# Patient Record
Sex: Male | Born: 1985 | Race: White | Hispanic: No | Marital: Single | Smoking: Former smoker
Health system: Southern US, Community
[De-identification: ages and names within clinical notes are randomized; demographics above are authoritative.]

---

## 2007-12-30 ENCOUNTER — Emergency Department: Payer: Self-pay | Admitting: Emergency Medicine

## 2008-01-01 ENCOUNTER — Emergency Department: Payer: Self-pay | Admitting: Internal Medicine

## 2008-01-28 ENCOUNTER — Emergency Department: Payer: Self-pay | Admitting: Emergency Medicine

## 2008-02-06 ENCOUNTER — Emergency Department: Payer: Self-pay | Admitting: Unknown Physician Specialty

## 2008-03-07 ENCOUNTER — Emergency Department: Payer: Self-pay | Admitting: Emergency Medicine

## 2008-09-29 ENCOUNTER — Emergency Department: Payer: Self-pay | Admitting: Emergency Medicine

## 2008-10-27 ENCOUNTER — Emergency Department: Payer: Self-pay | Admitting: Emergency Medicine

## 2009-01-12 ENCOUNTER — Emergency Department (HOSPITAL_COMMUNITY): Admission: EM | Admit: 2009-01-12 | Discharge: 2009-01-12 | Payer: Self-pay | Admitting: Emergency Medicine

## 2009-02-14 ENCOUNTER — Emergency Department (HOSPITAL_COMMUNITY): Admission: EM | Admit: 2009-02-14 | Discharge: 2009-02-14 | Payer: Self-pay | Admitting: Emergency Medicine

## 2009-02-15 ENCOUNTER — Emergency Department (HOSPITAL_COMMUNITY): Admission: EM | Admit: 2009-02-15 | Discharge: 2009-02-16 | Payer: Self-pay | Admitting: Emergency Medicine

## 2009-02-20 ENCOUNTER — Emergency Department: Payer: Self-pay | Admitting: Unknown Physician Specialty

## 2009-05-02 ENCOUNTER — Inpatient Hospital Stay: Payer: Self-pay | Admitting: Psychiatry

## 2009-05-02 ENCOUNTER — Inpatient Hospital Stay: Payer: Self-pay | Admitting: Internal Medicine

## 2011-08-31 ENCOUNTER — Telehealth: Payer: Self-pay

## 2011-08-31 NOTE — Telephone Encounter (Signed)
Pt states he needs to talk to dr Merla Riches re: new rx and a refil

## 2011-09-01 ENCOUNTER — Ambulatory Visit (INDEPENDENT_AMBULATORY_CARE_PROVIDER_SITE_OTHER): Payer: Self-pay | Admitting: Family Medicine

## 2011-09-01 DIAGNOSIS — G8929 Other chronic pain: Secondary | ICD-10-CM

## 2011-09-01 DIAGNOSIS — F909 Attention-deficit hyperactivity disorder, unspecified type: Secondary | ICD-10-CM

## 2011-09-01 DIAGNOSIS — F410 Panic disorder [episodic paroxysmal anxiety] without agoraphobia: Secondary | ICD-10-CM

## 2011-09-01 MED ORDER — TRAMADOL HCL 50 MG PO TABS: 50.0000 mg | ORAL_TABLET | Freq: Three times a day (TID) | ORAL | Status: AC | PRN

## 2011-09-01 MED ORDER — AMPHETAMINE-DEXTROAMPHETAMINE 30 MG PO TABS: 30.0000 mg | ORAL_TABLET | Freq: Two times a day (BID) | ORAL | Status: DC

## 2011-09-01 MED ORDER — CLONAZEPAM 2 MG PO TABS: 2.0000 mg | ORAL_TABLET | Freq: Two times a day (BID) | ORAL | Status: DC | PRN

## 2011-09-01 NOTE — Telephone Encounter (Signed)
Pt called a second time requesting rx refills. Please call pt at [phone] or [phone]

## 2011-09-01 NOTE — Telephone Encounter (Signed)
Patient calling would like for someone to contact he has some questions that need to be answered.

## 2011-09-01 NOTE — Progress Notes (Signed)
  Subjective:    Patient ID: Salmaan Patchin, male    DOB: January 19, 1986, 26 y.o.   MRN: [id number]  HPI 26 yo male with ADD and panic disorder here for medication refills.  Add - on adderall 30 BID Panic - klonopin 2 Tid Doing well.  Trying to establish new PCP in [place] (moved there)  Also, aggravation of chronic pain from moving.  Past reaction to mobic Ibuprofen 800s not working.  Back, neck.  Makes it difficult to sleep   Review of Systems Negative except as per HPI     Objective:   Physical Exam  Constitutional: He appears well-developed and well-nourished.  Cardiovascular: Normal rate, regular rhythm, normal heart sounds and intact distal pulses.   Pulmonary/Chest: Effort normal and breath sounds normal.  Musculoskeletal: Normal range of motion.          Assessment & Plan:  ADD Panic Disorder Pain  See rx's When establishes pcp, can have records sent to support med usage.

## 2011-09-03 ENCOUNTER — Telehealth: Payer: Self-pay

## 2011-09-03 NOTE — Telephone Encounter (Signed)
Pt usually sees Dr. Merla Riches, he wasn't here so he recently saw Dr. Georgiana Shore. PT concerned as was not given any refills on his adderol. [phone]

## 2011-09-04 NOTE — Telephone Encounter (Signed)
Was given rx for adderall. It cannot have refills on it per the law. He can call for refills when needed. Justin Kelley

## 2011-09-05 NOTE — Telephone Encounter (Signed)
COULD NOT LEAVE MESSAGE VM HAD NOT BEEN SET UP YET

## 2011-09-05 NOTE — Telephone Encounter (Signed)
PT WAS SEEN BY DR Georgiana Shore AND WAS GIVEN RX FOR ADDERALL.  HE GOT HIS MED FILL AT [facility] AND THE PHARMACIST MESSED UP HIS ORDER AND ONLY GAVE HIM 36 OF HIS ADDERALL BECAUSE HE WAS GETTING TRAMADOL AS WELL AND ONLY WANTED A FEW OF THOSE.  PT IS VERY UPSET WITH PHARMACY.  HE WOULD LIKE TO KNOW IF CAN GET AN RX FOR WHAT PILLS HE DID NOT GET AND ALSO RX'S WRITTEN FOR THE NEXT TWO MONTHS FOR ADDERALL.

## 2011-09-05 NOTE — Telephone Encounter (Signed)
He should have arranged to see me if he wanted me to be involved in this. He'll havr to square away any med shortage with the pharmacist with Dr Georgiana Shore. I can't give him another 2 months without seeing him. He should have planned ahead and made an appt with me.

## 2011-09-06 MED ORDER — AMPHETAMINE-DEXTROAMPHETAMINE 30 MG PO TABS: 30.0000 mg | ORAL_TABLET | Freq: Two times a day (BID) | ORAL | Status: DC

## 2011-09-06 NOTE — Telephone Encounter (Signed)
We must verify this with the pharmacy.  They should have record of what was filled and how much.  Obtain the pharmacy he filled it at and call them and verify what happened.  If he is unable to give this information, I will not give any more adderall.  If he can give the info and the pharmacy verifies full rX was not given, please ask if we must write a new Rx or if the remainder of the pills can be picked up.  I feel the remainder should be able to be picked up but I may be wrong.  I will not give any more adderall until this has been verified.

## 2011-09-06 NOTE — Telephone Encounter (Signed)
Pt reported he got Rxs filled at [facility]. Called pharmacy and was told pt picked up #36 of Adderall 30 mg, and #30 of tramadol. They do need a new Rx written for the add'l #24 Adderall pt needs to finish supply for the month.

## 2011-09-06 NOTE — Telephone Encounter (Signed)
Dr Georgiana Shore, can you please address this since you were the provider that Rxd last? Evidently, pt asked pharmacist to fill complete Rx of adderall but only part of the tramadol Rx. Reports the pharmacist got it backwards and only gave him part of the adderall and all of the tramadol, so he doesn't have enough adderall.

## 2011-09-06 NOTE — Telephone Encounter (Addendum)
Pt thanked [place] for straightening out this month, but asked if MD had written for next two months. Told him I would check with Dr Georgiana Shore, but he needs to establish with MD in [facility]. Checked with Dr Georgiana Shore and she will not RF for any more months. Explained to pt and also that Dr Merla Riches would need to see him again first. Pt requests [place] to mail his current Rx and he may try to drive back to see Dr Merla Riches if he cant get into see a psychiatrist in [facility]. Mailed pt's RX

## 2011-09-06 NOTE — Telephone Encounter (Signed)
THank you for investigating that.  New Rx entered in and printed off.  Please let patient know ready for pick up.  We are not allowed, by law, to put refills on adderall.  Must have new Rx each month.  F/u with Dr. Merla Riches for next Rxs.

## 2011-09-13 ENCOUNTER — Ambulatory Visit: Payer: Self-pay | Admitting: Internal Medicine

## 2011-09-13 DIAGNOSIS — F410 Panic disorder [episodic paroxysmal anxiety] without agoraphobia: Secondary | ICD-10-CM

## 2011-09-13 DIAGNOSIS — F909 Attention-deficit hyperactivity disorder, unspecified type: Secondary | ICD-10-CM

## 2011-09-13 DIAGNOSIS — F4001 Agoraphobia with panic disorder: Secondary | ICD-10-CM

## 2011-09-13 MED ORDER — AMPHETAMINE-DEXTROAMPHETAMINE 30 MG PO TABS: 30.0000 mg | ORAL_TABLET | Freq: Two times a day (BID) | ORAL | Status: DC

## 2011-09-13 MED ORDER — AMPHETAMINE-DEXTROAMPHETAMINE 30 MG PO TABS: 30.0000 mg | ORAL_TABLET | Freq: Every day | ORAL | Status: DC

## 2011-09-13 NOTE — Progress Notes (Signed)
  Subjective:    Patient ID: Justin Kelley, male    DOB: [birth year], 26 y.o.   MRN: [id number]  HPI Returns for add meds ..fired from [employer]-at least his 10th job loss He continues to struggle with no clear means of support and to ailing parents are separated one in [place] and one in [place]. He has had multiple psychiatric evaluations without clear diagnosis or treatment plans that he can explain though he knows the multiple diagnoses he's been given. It's clear that he's been ADD since early childhood and school was always difficult without medication. He went through all the non-stimulant medicines and has no effect. He has no current therapist nor has he been able to find a psychiatrist that will take his case. He is in the the disability approval process for Medicare. Clonopin has stabilized his anxiety. He has no current depression or suicide ideology.   Review of Systems     Objective:   Physical Exam Oriented to time person and place Vital signs normal     Assessment & Plan:  Problem #1  ADHD  Problem #2 generalized anxiety disorder with insomnia Problem #3 depression Problem #4 agoraphobia Problem #5 past history of opiate abuse  Adderall 30 mg twice a day #60x3 May call for Klonopin refill x1 so that length of the prescription will match Adderall Advised to go to the mental health center in [facility] for psychiatric evaluation in order to consider bipolarity  Followup 3 months

## 2011-10-11 ENCOUNTER — Telehealth: Payer: Self-pay

## 2011-10-11 NOTE — Telephone Encounter (Signed)
[facility] [place] [facility] because [facility] states he should be taking 3/day for Clonapam. Pharmacy states rx says 2/day. Please call [phone] to clarify.

## 2011-10-12 ENCOUNTER — Telehealth: Payer: Self-pay

## 2011-10-12 MED ORDER — CLONAZEPAM 2 MG PO TABS: 2.0000 mg | ORAL_TABLET | Freq: Three times a day (TID) | ORAL | Status: DC | PRN

## 2011-10-12 NOTE — Telephone Encounter (Signed)
[facility]ed in [facility] to [facility] [place], pt notified.

## 2011-10-12 NOTE — Telephone Encounter (Signed)
Chart reviewed.  Clonazepam was for TID.

## 2011-10-12 NOTE — Telephone Encounter (Signed)
Please advise 

## 2011-10-12 NOTE — Telephone Encounter (Signed)
Pt calling again for the 2nd time saying that there is issues with the pharmacy and his clonzipam he needs this medication immediately he has been out all day  [facility] [place]

## 2011-10-12 NOTE — Telephone Encounter (Signed)
Pt states his pharmacy contacted [place] with a request for Clonazpam (generic for klonopin  He takes 1 table of 2mg  3 x day [facility] [phone], [place]    [facility] to [facility]  [phone]  He has been out for a full day and is now shaky.

## 2011-10-13 ENCOUNTER — Other Ambulatory Visit: Payer: Self-pay | Admitting: Internal Medicine

## 2011-10-13 NOTE — Telephone Encounter (Signed)
This Rx has been dealt with through [employer] [facility] on 3/19.  I spoke with pharmacist at [facility] that they should not give the RF because we went another RX with correct directions to pharmacy.

## 2011-11-12 ENCOUNTER — Telehealth: Payer: Self-pay

## 2011-11-12 MED ORDER — CLONAZEPAM 2 MG PO TABS: 2.0000 mg | ORAL_TABLET | Freq: Three times a day (TID) | ORAL | Status: DC | PRN

## 2011-11-12 NOTE — Telephone Encounter (Signed)
This was documented in his last office note Klonopin 2 mg 3 times a day #90 Followup for further meds

## 2011-11-12 NOTE — Telephone Encounter (Signed)
Can we prescribe this? 

## 2011-11-12 NOTE — Telephone Encounter (Signed)
PT IN NEED OF CLONAZEPAM. STATES DR DOOLITTLE TOLD HIM TO CALL WHEN HE WANTED [place] TO CALL IT IN FOR HIM PLEASE CALL [phone]    [facility]

## 2011-11-13 NOTE — Telephone Encounter (Signed)
Faxed RX to [facility], [facility] to notify pt

## 2011-12-10 ENCOUNTER — Ambulatory Visit: Payer: Self-pay | Admitting: Internal Medicine

## 2011-12-10 VITALS — BP 130/79 | HR 79 | Temp 98.6°F | Resp 18 | Ht 69.75 in | Wt 199.0 lb

## 2011-12-10 DIAGNOSIS — F411 Generalized anxiety disorder: Secondary | ICD-10-CM

## 2011-12-10 DIAGNOSIS — F988 Other specified behavioral and emotional disorders with onset usually occurring in childhood and adolescence: Secondary | ICD-10-CM

## 2011-12-10 MED ORDER — AMPHETAMINE-DEXTROAMPHETAMINE 30 MG PO TABS: 30.0000 mg | ORAL_TABLET | Freq: Two times a day (BID) | ORAL | Status: DC

## 2011-12-10 MED ORDER — CLONAZEPAM 2 MG PO TABS: 2.0000 mg | ORAL_TABLET | Freq: Three times a day (TID) | ORAL | Status: DC | PRN

## 2011-12-10 MED ORDER — CLONAZEPAM 2 MG PO TABS: 2.0000 mg | ORAL_TABLET | Freq: Two times a day (BID) | ORAL | Status: DC | PRN

## 2011-12-10 MED ORDER — AMPHETAMINE-DEXTROAMPHETAMINE 30 MG PO TABS: 30.0000 mg | ORAL_TABLET | Freq: Every day | ORAL | Status: DC

## 2011-12-10 NOTE — Progress Notes (Signed)
  Subjective:    Patient ID: Justin Kelley, male    DOB: 1986-02-27, 26 y.o.   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/now living with mom who is paranoid schizophrenic/he has had a recent MRSA infection/no longer needs trazodone for sleep/continues with clonazepam 2 mg 3 times a day for anxiety disorder on a when necessary basis/continues to use Adderall for his ADD/has not seen a psychiatrist because he can't afford it    Review of Systems     Objective:   Physical Exam Vital signs stable/ Neuro intact       Assessment & Plan:  Problem #1 ADD Problem #2 generalized anxiety disorder He  has other psychiatric issues that he is not attending to due to his impoverish state/He is advised to go to the mental Health Center in [facility] and see if he can arrange services/he says he now has a close friend who is a CNA from [employer] who is helping him get things done and she is helping to look for a psychiatrist Meds ordered this encounter  Medications  .       .               . amphetamine-dextroamphetamine (ADDERALL) 30 MG tablet    Sig: Take 1 tablet (30 mg total) by mouth 2 (two) times daily.    Dispense:  60 tablet    Refill:  0  . amphetamine-dextroamphetamine (ADDERALL) 30 MG tablet    Sig: Take 1 tablet (30 mg total) by mouth 2 (two) times daily.    Dispense:  60 tablet    Refill:  0  . amphetamine-dextroamphetamine (ADDERALL) 30 MG tablet    Sig: Take 1 tablet (30 mg total) by mouth twice daily    Dispense:  60 tablet    Refill:  0  . clonazePAM (KLONOPIN) 2 MG tablet    Sig: Take 1 tablet (2 mg total) by mouth 3 (three) times daily as needed for anxiety.    Dispense:  90 tablet    Refill:  2  Recheck in 3 months/I'll try to manage his medications until he can get into adequate psychiatric care

## 2012-03-13 ENCOUNTER — Ambulatory Visit (INDEPENDENT_AMBULATORY_CARE_PROVIDER_SITE_OTHER): Payer: Self-pay | Admitting: Internal Medicine

## 2012-03-13 VITALS — BP 128/72 | HR 72 | Temp 98.5°F | Resp 16 | Wt 194.0 lb

## 2012-03-13 DIAGNOSIS — F411 Generalized anxiety disorder: Secondary | ICD-10-CM

## 2012-03-13 DIAGNOSIS — F4001 Agoraphobia with panic disorder: Secondary | ICD-10-CM

## 2012-03-13 DIAGNOSIS — F909 Attention-deficit hyperactivity disorder, unspecified type: Secondary | ICD-10-CM

## 2012-03-13 DIAGNOSIS — G4700 Insomnia, unspecified: Secondary | ICD-10-CM

## 2012-03-13 DIAGNOSIS — Z22322 Carrier or suspected carrier of Methicillin resistant Staphylococcus aureus: Secondary | ICD-10-CM

## 2012-03-13 MED ORDER — AMPHETAMINE-DEXTROAMPHETAMINE 30 MG PO TABS: 30.0000 mg | ORAL_TABLET | Freq: Two times a day (BID) | ORAL | Status: DC

## 2012-03-13 MED ORDER — CITALOPRAM HYDROBROMIDE 20 MG PO TABS: 20.0000 mg | ORAL_TABLET | Freq: Every day | ORAL | Status: DC

## 2012-03-13 MED ORDER — ALPRAZOLAM 1 MG PO TABS: 1.0000 mg | ORAL_TABLET | Freq: Three times a day (TID) | ORAL | Status: AC | PRN

## 2012-03-13 MED ORDER — MUPIROCIN 2 % EX OINT: TOPICAL_OINTMENT | Freq: Every day | CUTANEOUS | Status: AC

## 2012-03-13 NOTE — Progress Notes (Signed)
  Subjective:    Patient ID: Justin Kelley, male    DOB: 12-05-1985, 26 y.o.   MRN: [id number]  HPI 1. Infected Bee stings patient states total of 14 stings on both arms.  Bee stings became infected and were treated elsewhere.  Placed on bactrim. Recurrent skin infections: will try Bactroban  2. Anxiety medications consult  Presently taking Trazodone and clonazepam trazodone: "zonks" patient out.   Clonazepam: not very much stress relief Not able to sleep very well, would like to switch to alprazolam Was given alprazolam at a healthcare facility and felt like this really helped with anxiety.   3. ADD followup Patient presently not working;is going to school/Living with parents/Current dose of Adderall helping the school  Review of Systems     Objective:   Physical Exam No active pustules or areas of cellulitis Psychiatric intact      Assessment & Plan:   1. ADHD (attention deficit hyperactivity disorder)   2. Agoraphobia with panic disorder -Would discontinue trazodone and go back to Celexa which has worked in the past/also changed to alprazolam instead of Klonopin  3. MRSA (methicillin resistant staph aureus) culture positive In the past Recurrent problems with folliculitis We'll try treatment with Bactroban intranasal for one to 2 months   Meds ordered this encounter  Medications  .       . amphetamine-dextroamphetamine (ADDERALL) 30 MG tablet    Sig: Take 1 tablet (30 mg total) by mouth 2 (two) times daily.    Dispense:  60 tablet    Refill:  0  . amphetamine-dextroamphetamine (ADDERALL) 30 MG tablet    Sig: Take 1 tablet (30 mg total) by mouth 2 (two) times daily. For after 04/13/12    Dispense:  60 tablet    Refill:  0  . amphetamine-dextroamphetamine (ADDERALL) 30 MG tablet    Sig: Take 1 tablet (30 mg total) by mouth 2 (two) times daily. For after 05/13/12    Dispense:  60 tablet    Refill:  0  . citalopram (CELEXA) 20 MG tablet    Sig: Take 1 tablet (20  mg total) by mouth daily.    Dispense:  30 tablet    Refill:  3  . ALPRAZolam (XANAX) 1 MG tablet    Sig: Take 1 tablet (1 mg total) by mouth 3 (three) times daily as needed for sleep.    Dispense:  90 tablet    Refill:  2  . mupirocin ointment (BACTROBAN) 2 %    Sig: Apply topically daily. To both nostrils    Dispense:  22 g    Refill:  2

## 2012-03-21 ENCOUNTER — Telehealth: Payer: Self-pay

## 2012-03-21 NOTE — Telephone Encounter (Signed)
Pt was seen by dr Merla Riches and he changed his medication. States symptoms are not better, he is more anxious and meds not helping Please call at [phone]

## 2012-03-22 NOTE — Telephone Encounter (Signed)
3mg  xanax a day is limit for someone on adderall Can increase celexa to 40mg  Can start buspar Could go back to klonopin 2mg  tid(if returns xanax)

## 2012-03-22 NOTE — Telephone Encounter (Signed)
1.  ADHD (attention deficit hyperactivity disorder)   2.  Agoraphobia with panic disorder -Would discontinue trazodone and go back to Celexa which has worked in the past/also changed to alprazolam instead of Klonopin    From 8/19 office visit please advise.

## 2012-03-23 NOTE — Telephone Encounter (Signed)
Pt returned call and ask to try and call him again. Best time to  Reach him is afternoon.  Phone [phone]

## 2012-03-23 NOTE — Telephone Encounter (Signed)
I have called him to advise, left message for him to call me back about his options.

## 2012-03-26 NOTE — Telephone Encounter (Signed)
LMOM to CB. 

## 2012-03-27 MED ORDER — CITALOPRAM HYDROBROMIDE 40 MG PO TABS: 40.0000 mg | ORAL_TABLET | Freq: Every day | ORAL | Status: DC

## 2012-03-27 MED ORDER — CLONAZEPAM 2 MG PO TABS: 2.0000 mg | ORAL_TABLET | Freq: Three times a day (TID) | ORAL | Status: DC

## 2012-03-27 NOTE — Telephone Encounter (Signed)
Increase Celexa to 40 mg Switch back to Klonopin and fax No need to return Xanax at this point

## 2012-03-27 NOTE — Telephone Encounter (Signed)
Pt is willing to try the increased dose of Celexa 40mg , but has taken Buspar in the past and doesn't like the way it makes him feel. Pt states he has about 10 tablets left of the xanax, but he has been having to take twice the dose because it hasn't been effective. Pt would like to go back on Klonopin but asked if he can take the xanax to pharmacy to save a trip to [place]? He stated if he doesn't get the klonopin tonight, he will really not have much xanax to return. Please send Rxs to [facility] in [place] and advise pt as to what to do.

## 2012-03-27 NOTE — Telephone Encounter (Signed)
Notified pt that Rxs were sent to pharmacy.

## 2012-04-10 ENCOUNTER — Emergency Department: Payer: Self-pay | Admitting: Emergency Medicine

## 2012-04-28 ENCOUNTER — Ambulatory Visit: Payer: Self-pay | Admitting: Orthopedic Surgery

## 2012-05-14 ENCOUNTER — Telehealth: Payer: Self-pay
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
## 2012-05-17 ENCOUNTER — Telehealth: Payer: Self-pay | Admitting: Radiology
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
## 2012-05-22 NOTE — Telephone Encounter (Signed)
nccsrs shows him to be in a drug treatment program that I am not aware of//think we should hold further prescribing until we can discuss with them/you can wait for him to call back

## 2012-05-23 NOTE — Telephone Encounter (Signed)
Left message for him to call me back.  

## 2012-05-23 NOTE — Telephone Encounter (Signed)
I have advised patient he needs office visit, he plans to come in this weekend.

## 2012-07-10 ENCOUNTER — Ambulatory Visit: Payer: Self-pay | Admitting: Internal Medicine

## 2012-07-10 VITALS — BP 136/87 | HR 82 | Temp 98.4°F | Resp 18 | Ht 72.0 in | Wt 198.0 lb

## 2012-07-10 DIAGNOSIS — F1120 Opioid dependence, uncomplicated: Secondary | ICD-10-CM

## 2012-07-10 DIAGNOSIS — F411 Generalized anxiety disorder: Secondary | ICD-10-CM

## 2012-07-10 DIAGNOSIS — F988 Other specified behavioral and emotional disorders with onset usually occurring in childhood and adolescence: Secondary | ICD-10-CM

## 2012-07-10 DIAGNOSIS — F410 Panic disorder [episodic paroxysmal anxiety] without agoraphobia: Secondary | ICD-10-CM

## 2012-07-10 DIAGNOSIS — F909 Attention-deficit hyperactivity disorder, unspecified type: Secondary | ICD-10-CM

## 2012-07-10 DIAGNOSIS — F4001 Agoraphobia with panic disorder: Secondary | ICD-10-CM

## 2012-07-10 MED ORDER — AMPHETAMINE-DEXTROAMPHETAMINE 30 MG PO TABS: 30.0000 mg | ORAL_TABLET | Freq: Two times a day (BID) | ORAL | Status: DC

## 2012-07-10 MED ORDER — CLONAZEPAM 2 MG PO TABS: 2.0000 mg | ORAL_TABLET | Freq: Three times a day (TID) | ORAL | Status: DC
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
## 2012-12-12 ENCOUNTER — Ambulatory Visit: Payer: Self-pay | Admitting: Internal Medicine

## 2012-12-12 VITALS — BP 134/78 | HR 91 | Temp 98.7°F | Resp 16 | Ht 70.5 in | Wt 210.4 lb

## 2012-12-12 DIAGNOSIS — F411 Generalized anxiety disorder: Secondary | ICD-10-CM

## 2012-12-12 DIAGNOSIS — F4001 Agoraphobia with panic disorder: Secondary | ICD-10-CM

## 2012-12-12 DIAGNOSIS — F988 Other specified behavioral and emotional disorders with onset usually occurring in childhood and adolescence: Secondary | ICD-10-CM

## 2012-12-12 DIAGNOSIS — F909 Attention-deficit hyperactivity disorder, unspecified type: Secondary | ICD-10-CM

## 2012-12-12 DIAGNOSIS — G4700 Insomnia, unspecified: Secondary | ICD-10-CM

## 2012-12-12 MED ORDER — AMPHETAMINE-DEXTROAMPHETAMINE 30 MG PO TABS: 30.0000 mg | ORAL_TABLET | Freq: Two times a day (BID) | ORAL | Status: AC

## 2012-12-12 MED ORDER — CLONAZEPAM 2 MG PO TABS: 2.0000 mg | ORAL_TABLET | Freq: Three times a day (TID) | ORAL | Status: AC | PRN

## 2012-12-12 MED ORDER — TRAZODONE HCL 150 MG PO TABS: 150.0000 mg | ORAL_TABLET | Freq: Every day | ORAL | Status: AC

## 2012-12-12 NOTE — Progress Notes (Signed)
  Subjective:    Patient ID: Justin Kelley, male    DOB: [birth year], 27 y.o.   MRN: [id number]  HPI Patient Active Problem List   Diagnosis Date Noted  . Opiate addiction--off sebutex and no opiate relapses 07/10/2012  . Agoraphobia with panic disorder--still needs meds KLON Would like to restart trazadone for sleep 09/13/2011  . ADHD (attention deficit hyperactivity disorder)---?back to school in psych?-still living with and caring for mother 09/01/2011  . Panic disorder 09/01/2011   No othe rhealth issues   Review of Systems Noncontributory.    Objective:   Physical Exam BP 134/78  Pulse 91  Temp(Src) 98.7 F (37.1 C) (Oral)  Resp 16  Ht 5' 10.5" (1.791 m)  Wt 210 lb 6.4 oz (95.437 kg)  BMI 29.75 kg/m2  SpO2 98% Mood good affect stable       Assessment & Plan:  Attention deficit disorder Generalized anxiety disorder Insomnia  Meds ordered this encounter  Medications  . amphetamine-dextroamphetamine (ADDERALL) 30 MG tablet    Sig: Take 1 tablet (30 mg total) by mouth 2 (two) times daily. For after 01/12/13    Dispense:  60 tablet    Refill:  0  . amphetamine-dextroamphetamine (ADDERALL) 30 MG tablet    Sig: Take 1 tablet (30 mg total) by mouth 2 (two) times daily.    Dispense:  60 tablet    Refill:  0  . amphetamine-dextroamphetamine (ADDERALL) 30 MG tablet    Sig: Take 1 tablet (30 mg total) by mouth 2 (two) times daily. For after 02/11/13    Dispense:  60 tablet    Refill:  0  . traZODone (DESYREL) 150 MG tablet    Sig: Take 1 tablet (150 mg total) by mouth at bedtime.    Dispense:  30 tablet    Refill:  5  . clonazePAM (KLONOPIN) 2 MG tablet    Sig: Take 1 tablet (2 mg total) by mouth 3 (three) times daily as needed for anxiety.    Dispense:  90 tablet    Refill:  5   Consider 3 more months of medication in 3mos

## 2013-01-12 ENCOUNTER — Emergency Department: Payer: Self-pay | Admitting: Emergency Medicine

## 2013-02-08 ENCOUNTER — Other Ambulatory Visit: Payer: Self-pay

## 2013-02-08 ENCOUNTER — Emergency Department (HOSPITAL_COMMUNITY)
Admission: EM | Admit: 2013-02-08 | Discharge: 2013-02-08 | Disposition: A | Discharge status: Home / Self Care | Payer: Self-pay | Attending: Emergency Medicine | Admitting: Emergency Medicine

## 2013-02-08 ENCOUNTER — Encounter (HOSPITAL_COMMUNITY): Payer: Self-pay | Admitting: Emergency Medicine

## 2013-02-08 DIAGNOSIS — Y9389 Activity, other specified: Secondary | ICD-10-CM | POA: Insufficient documentation

## 2013-02-08 DIAGNOSIS — T401X1A Poisoning by heroin, accidental (unintentional), initial encounter: Secondary | ICD-10-CM | POA: Insufficient documentation

## 2013-02-08 DIAGNOSIS — Y92410 Unspecified street and highway as the place of occurrence of the external cause: Secondary | ICD-10-CM | POA: Insufficient documentation

## 2013-02-08 DIAGNOSIS — T401X4A Poisoning by heroin, undetermined, initial encounter: Secondary | ICD-10-CM | POA: Insufficient documentation

## 2013-02-08 DIAGNOSIS — Z87891 Personal history of nicotine dependence: Secondary | ICD-10-CM | POA: Insufficient documentation

## 2013-02-08 DIAGNOSIS — Z79899 Other long term (current) drug therapy: Secondary | ICD-10-CM | POA: Insufficient documentation
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
## 2013-02-08 NOTE — ED Notes (Signed)
ZOX:WRUE<AV> Expected date:<BR> Expected time:<BR> Means of arrival:<BR> Comments:<BR> EMS, Heroin OD; Narcan given; pt was apneic upon EMS arrival, now alert
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
## 2013-02-13 ENCOUNTER — Telehealth: Payer: Self-pay
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
## 2013-02-14 NOTE — Telephone Encounter (Signed)
Rtn pt call- states he lost his wallet about a week ago. He states his Adderal Rx was in his wallet. He was seen at the ER on July 17th for a Narcotic overdose. He is doing fine now. He needs his Adderal refill. Please advise.

## 2013-02-15 NOTE — Telephone Encounter (Signed)
Spoke with patient and let him know that Dr. Merla Riches could no longer prescribe the controlled substances.  He stated that he understood and he has a psychiatrist that he is trying to get in with.  Will call back if he needs a referral.

## 2013-02-15 NOTE — Telephone Encounter (Signed)
This is trouble for him. His use of opiates now disqualifies him for treatment by me with other controlled substances which would include adderall as well as klonopin. He will have to be treated by a psychiatrist or someone certified in substance abuse treatment. He could start at the [facility]/or with any private psychiatrist. Do he need a referral by [place]? Would he prefer [place] [place] or [place] where I think he lives?

## 2013-02-16 ENCOUNTER — Emergency Department: Payer: Self-pay | Admitting: Emergency Medicine

## 2013-02-16 LAB — URINALYSIS, COMPLETE
Bacteria: NONE SEEN
Bilirubin,UR: NEGATIVE
Glucose,UR: NEGATIVE mg/dL (ref 0–75)
Nitrite: NEGATIVE
Ph: 5 (ref 4.5–8.0)
Protein: 30
Transitional Epi: <1

## 2013-02-16 LAB — DRUG SCREEN, URINE
Benzodiazepine, Ur Scrn: POSITIVE (ref ?–200)
MDMA (Ecstasy)Ur Screen: NEGATIVE (ref ?–500)
Methadone, Ur Screen: NEGATIVE (ref ?–300)
Opiate, Ur Screen: POSITIVE (ref ?–300)

## 2013-02-16 LAB — CBC
MCHC: 33.6 g/dL (ref 32.0–36.0)
Platelet: 282 10*3/uL (ref 150–440)
WBC: 22.2 10*3/uL — ABNORMAL HIGH (ref 3.8–10.6)

## 2013-02-17 LAB — CBC
MCHC: 33.7 g/dL (ref 32.0–36.0)
MCV: 83 fL (ref 80–100)
Platelet: 249 10*3/uL (ref 150–440)
RBC: 4.79 10*6/uL (ref 4.40–5.90)

## 2013-02-17 LAB — COMPREHENSIVE METABOLIC PANEL
Alkaline Phosphatase: 64 U/L (ref 50–136)
Anion Gap: 7 (ref 7–16)
BUN: 17 mg/dL (ref 7–18)
Bilirubin,Total: 0.2 mg/dL (ref 0.2–1.0)
Calcium, Total: 8.9 mg/dL (ref 8.5–10.1)
Chloride: 107 mmol/L (ref 98–107)
EGFR (Non-African Amer.): >60
Glucose: 109 mg/dL — ABNORMAL HIGH (ref 65–99)
Potassium: 3.9 mmol/L (ref 3.5–5.1)
SGOT(AST): 19 U/L (ref 15–37)
Sodium: 139 mmol/L (ref 136–145)
Total Protein: 8.5 g/dL — ABNORMAL HIGH (ref 6.4–8.2)

## 2013-02-17 LAB — ETHANOL: Ethanol: <3 mg/dL

## 2013-02-18 LAB — URINE CULTURE

## 2013-02-22 LAB — CULTURE, BLOOD (SINGLE)

## 2013-02-28 ENCOUNTER — Telehealth: Payer: Self-pay

## 2013-02-28 DIAGNOSIS — F411 Generalized anxiety disorder: Secondary | ICD-10-CM

## 2013-02-28 NOTE — Telephone Encounter (Signed)
Pt talked to Dr Merla Riches while back about getting a referral to psychiatrist. The patient has found one that he likes and he wants to know if he can still get the referral Call back number is [phone]    [facility] [phone] Fax [phone]

## 2013-02-28 NOTE — Telephone Encounter (Signed)
Yes, referral made

## 2013-11-13 ENCOUNTER — Emergency Department: Payer: Self-pay | Admitting: Emergency Medicine

## 2013-11-13 LAB — BASIC METABOLIC PANEL
ANION GAP: 7 (ref 7–16)
BUN: 7 mg/dL (ref 7–18)
CO2: 25 mmol/L (ref 21–32)
Calcium, Total: 8.4 mg/dL — ABNORMAL LOW (ref 8.5–10.1)
Chloride: 104 mmol/L (ref 98–107)
Creatinine: 0.93 mg/dL (ref 0.60–1.30)
EGFR (Non-African Amer.): >60
GLUCOSE: 78 mg/dL (ref 65–99)
OSMOLALITY: 269 (ref 275–301)
POTASSIUM: 3.4 mmol/L — AB (ref 3.5–5.1)
Sodium: 136 mmol/L (ref 136–145)

## 2013-11-13 LAB — CBC
HCT: 43.5 % (ref 40.0–52.0)
HGB: 14.3 g/dL (ref 13.0–18.0)
MCH: 26.5 pg (ref 26.0–34.0)
MCHC: 32.9 g/dL (ref 32.0–36.0)
MCV: 81 fL (ref 80–100)
Platelet: 312 10*3/uL (ref 150–440)
RBC: 5.39 10*6/uL (ref 4.40–5.90)
RDW: 14 % (ref 11.5–14.5)
WBC: 21.3 10*3/uL — AB (ref 3.8–10.6)

## 2013-11-14 ENCOUNTER — Inpatient Hospital Stay: Payer: Self-pay | Admitting: Internal Medicine

## 2013-11-14 LAB — BASIC METABOLIC PANEL
Anion Gap: 5 — ABNORMAL LOW (ref 7–16)
BUN: 7 mg/dL (ref 7–18)
CO2: 29 mmol/L (ref 21–32)
CREATININE: 1.02 mg/dL (ref 0.60–1.30)
Calcium, Total: 8.2 mg/dL — ABNORMAL LOW (ref 8.5–10.1)
Chloride: 100 mmol/L (ref 98–107)
EGFR (African American): >60
EGFR (Non-African Amer.): >60
GLUCOSE: 67 mg/dL (ref 65–99)
OSMOLALITY: 264 (ref 275–301)
POTASSIUM: 3 mmol/L — AB (ref 3.5–5.1)
Sodium: 134 mmol/L — ABNORMAL LOW (ref 136–145)

## 2013-11-14 LAB — CBC WITH DIFFERENTIAL/PLATELET
Basophil #: 0.1 10*3/uL (ref 0.0–0.1)
Basophil %: 0.6 %
EOS ABS: 0.1 10*3/uL (ref 0.0–0.7)
Eosinophil %: 0.5 %
HCT: 40.8 % (ref 40.0–52.0)
HGB: 13.2 g/dL (ref 13.0–18.0)
Lymphocyte #: 2.5 10*3/uL (ref 1.0–3.6)
Lymphocyte %: 12.5 %
MCH: 26.3 pg (ref 26.0–34.0)
MCHC: 32.4 g/dL (ref 32.0–36.0)
MCV: 81 fL (ref 80–100)
Monocyte #: 1.3 x10 3/mm — ABNORMAL HIGH (ref 0.2–1.0)
Monocyte %: 6.6 %
NEUTROS ABS: 16 10*3/uL — AB (ref 1.4–6.5)
NEUTROS PCT: 79.8 %
Platelet: 271 10*3/uL (ref 150–440)
RBC: 5.03 10*6/uL (ref 4.40–5.90)
RDW: 14.1 % (ref 11.5–14.5)
WBC: 20 10*3/uL — ABNORMAL HIGH (ref 3.8–10.6)

## 2013-11-16 LAB — BASIC METABOLIC PANEL
ANION GAP: 8 (ref 7–16)
BUN: 7 mg/dL (ref 7–18)
CHLORIDE: 101 mmol/L (ref 98–107)
Calcium, Total: 8.8 mg/dL (ref 8.5–10.1)
Co2: 27 mmol/L (ref 21–32)
Creatinine: 0.9 mg/dL (ref 0.60–1.30)
EGFR (African American): >60
EGFR (Non-African Amer.): >60
GLUCOSE: 81 mg/dL (ref 65–99)
Osmolality: 269 (ref 275–301)
Potassium: 3.7 mmol/L (ref 3.5–5.1)
Sodium: 136 mmol/L (ref 136–145)

## 2013-11-16 LAB — CBC WITH DIFFERENTIAL/PLATELET
BASOS PCT: 0.7 %
Basophil #: 0.1 10*3/uL (ref 0.0–0.1)
Eosinophil #: 0.2 10*3/uL (ref 0.0–0.7)
Eosinophil %: 1.3 %
HCT: 42.2 % (ref 40.0–52.0)
HGB: 13.8 g/dL (ref 13.0–18.0)
LYMPHS PCT: 13.5 %
Lymphocyte #: 2.2 10*3/uL (ref 1.0–3.6)
MCH: 26.7 pg (ref 26.0–34.0)
MCHC: 32.7 g/dL (ref 32.0–36.0)
MCV: 82 fL (ref 80–100)
MONOS PCT: 6.2 %
Monocyte #: 1 x10 3/mm (ref 0.2–1.0)
NEUTROS PCT: 78.3 %
Neutrophil #: 12.5 10*3/uL — ABNORMAL HIGH (ref 1.4–6.5)
PLATELETS: 307 10*3/uL (ref 150–440)
RBC: 5.18 10*6/uL (ref 4.40–5.90)
RDW: 14 % (ref 11.5–14.5)
WBC: 16 10*3/uL — ABNORMAL HIGH (ref 3.8–10.6)

## 2013-11-16 LAB — VANCOMYCIN, TROUGH: VANCOMYCIN, TROUGH: 12 ug/mL (ref 10–20)

## 2013-11-17 LAB — BASIC METABOLIC PANEL
ANION GAP: 4 — AB (ref 7–16)
BUN: 4 mg/dL — AB (ref 7–18)
CHLORIDE: 103 mmol/L (ref 98–107)
CO2: 28 mmol/L (ref 21–32)
CREATININE: 0.98 mg/dL (ref 0.60–1.30)
Calcium, Total: 8.7 mg/dL (ref 8.5–10.1)
EGFR (African American): >60
Glucose: 87 mg/dL (ref 65–99)
Osmolality: 266 (ref 275–301)
Potassium: 3.8 mmol/L (ref 3.5–5.1)
SODIUM: 135 mmol/L — AB (ref 136–145)

## 2013-11-17 LAB — CBC WITH DIFFERENTIAL/PLATELET
Basophil #: 0.1 10*3/uL (ref 0.0–0.1)
Basophil %: 0.8 %
EOS PCT: 2.4 %
Eosinophil #: 0.3 10*3/uL (ref 0.0–0.7)
HCT: 41.4 % (ref 40.0–52.0)
HGB: 13.4 g/dL (ref 13.0–18.0)
Lymphocyte #: 2 10*3/uL (ref 1.0–3.6)
Lymphocyte %: 18.3 %
MCH: 26.3 pg (ref 26.0–34.0)
MCHC: 32.3 g/dL (ref 32.0–36.0)
MCV: 81 fL (ref 80–100)
Monocyte #: 0.7 x10 3/mm (ref 0.2–1.0)
Monocyte %: 6.6 %
Neutrophil #: 7.8 10*3/uL — ABNORMAL HIGH (ref 1.4–6.5)
Neutrophil %: 71.9 %
Platelet: 317 10*3/uL (ref 150–440)
RBC: 5.08 10*6/uL (ref 4.40–5.90)
RDW: 14.1 % (ref 11.5–14.5)
WBC: 10.8 10*3/uL — ABNORMAL HIGH (ref 3.8–10.6)

## 2013-11-17 LAB — VANCOMYCIN, TROUGH: VANCOMYCIN, TROUGH: 14 ug/mL (ref 10–20)

## 2013-11-18 LAB — BASIC METABOLIC PANEL
ANION GAP: 5 — AB (ref 7–16)
BUN: 5 mg/dL — AB (ref 7–18)
CALCIUM: 8.9 mg/dL (ref 8.5–10.1)
Chloride: 104 mmol/L (ref 98–107)
Co2: 27 mmol/L (ref 21–32)
Creatinine: 0.9 mg/dL (ref 0.60–1.30)
EGFR (African American): >60
EGFR (Non-African Amer.): >60
Glucose: 85 mg/dL (ref 65–99)
Osmolality: 268 (ref 275–301)
Potassium: 4 mmol/L (ref 3.5–5.1)
Sodium: 136 mmol/L (ref 136–145)

## 2013-11-18 LAB — CBC WITH DIFFERENTIAL/PLATELET
BASOS ABS: 2 %
Comment - H1-Com2: NORMAL
EOS PCT: 3 %
HCT: 40.8 % (ref 40.0–52.0)
HGB: 13.4 g/dL (ref 13.0–18.0)
LYMPHS PCT: 36 %
MCH: 26.9 pg (ref 26.0–34.0)
MCHC: 32.8 g/dL (ref 32.0–36.0)
MCV: 82 fL (ref 80–100)
Monocytes: 9 %
Platelet: 307 10*3/uL (ref 150–440)
RBC: 4.98 10*6/uL (ref 4.40–5.90)
RDW: 14.2 % (ref 11.5–14.5)
SEGMENTED NEUTROPHILS: 50 %
WBC: 8.3 10*3/uL (ref 3.8–10.6)

## 2013-11-18 LAB — CULTURE, BLOOD (SINGLE)

## 2013-11-21 LAB — WOUND CULTURE

## 2013-12-14 ENCOUNTER — Inpatient Hospital Stay: Payer: Self-pay | Admitting: Internal Medicine

## 2013-12-14 LAB — COMPREHENSIVE METABOLIC PANEL
ALK PHOS: 82 U/L
AST: 43 U/L — AB (ref 15–37)
Albumin: 3.7 g/dL (ref 3.4–5.0)
Anion Gap: 6 — ABNORMAL LOW (ref 7–16)
BUN: 13 mg/dL (ref 7–18)
Bilirubin,Total: 0.5 mg/dL (ref 0.2–1.0)
CO2: 29 mmol/L (ref 21–32)
Calcium, Total: 8.9 mg/dL (ref 8.5–10.1)
Chloride: 101 mmol/L (ref 98–107)
Creatinine: 1.18 mg/dL (ref 0.60–1.30)
EGFR (African American): >60
EGFR (Non-African Amer.): >60
GLUCOSE: 82 mg/dL (ref 65–99)
Osmolality: 271 (ref 275–301)
Potassium: 4.8 mmol/L (ref 3.5–5.1)
SGPT (ALT): 29 U/L (ref 12–78)
Sodium: 136 mmol/L (ref 136–145)
TOTAL PROTEIN: 8.3 g/dL — AB (ref 6.4–8.2)

## 2013-12-14 LAB — CBC WITH DIFFERENTIAL/PLATELET
Basophil #: 0.1 10*3/uL (ref 0.0–0.1)
Basophil %: 0.7 %
EOS ABS: 0.2 10*3/uL (ref 0.0–0.7)
EOS PCT: 2.5 %
HCT: 42.9 % (ref 40.0–52.0)
HGB: 14.3 g/dL (ref 13.0–18.0)
Lymphocyte #: 2.2 10*3/uL (ref 1.0–3.6)
Lymphocyte %: 21.9 %
MCH: 25.9 pg — ABNORMAL LOW (ref 26.0–34.0)
MCHC: 33.3 g/dL (ref 32.0–36.0)
MCV: 78 fL — ABNORMAL LOW (ref 80–100)
MONO ABS: 0.6 x10 3/mm (ref 0.2–1.0)
Monocyte %: 6 %
NEUTROS PCT: 68.9 %
Neutrophil #: 7 10*3/uL — ABNORMAL HIGH (ref 1.4–6.5)
Platelet: 207 10*3/uL (ref 150–440)
RBC: 5.51 10*6/uL (ref 4.40–5.90)
RDW: 14 % (ref 11.5–14.5)
WBC: 10.1 10*3/uL (ref 3.8–10.6)

## 2013-12-14 LAB — TROPONIN I

## 2013-12-14 LAB — TSH: Thyroid Stimulating Horm: 5.12 u[IU]/mL — ABNORMAL HIGH

## 2013-12-15 LAB — URINALYSIS, COMPLETE
BLOOD: NEGATIVE
Bacteria: NONE SEEN
Bilirubin,UR: NEGATIVE
Glucose,UR: NEGATIVE mg/dL (ref 0–75)
Ketone: NEGATIVE
Leukocyte Esterase: NEGATIVE
NITRITE: NEGATIVE
Ph: 7 (ref 4.5–8.0)
Protein: NEGATIVE
Specific Gravity: 1.012 (ref 1.003–1.030)
Squamous Epithelial: NONE SEEN
WBC UR: 1 /HPF (ref 0–5)

## 2013-12-15 LAB — BASIC METABOLIC PANEL
Anion Gap: 3 — ABNORMAL LOW (ref 7–16)
BUN: 14 mg/dL (ref 7–18)
CALCIUM: 8.9 mg/dL (ref 8.5–10.1)
CHLORIDE: 103 mmol/L (ref 98–107)
Co2: 29 mmol/L (ref 21–32)
Creatinine: 1.45 mg/dL — ABNORMAL HIGH (ref 0.60–1.30)
EGFR (African American): >60
GLUCOSE: 88 mg/dL (ref 65–99)
OSMOLALITY: 270 (ref 275–301)
Potassium: 4.2 mmol/L (ref 3.5–5.1)
Sodium: 135 mmol/L — ABNORMAL LOW (ref 136–145)

## 2013-12-15 LAB — CBC WITH DIFFERENTIAL/PLATELET
Basophil #: 0.1 10*3/uL (ref 0.0–0.1)
Basophil %: 0.9 %
EOS PCT: 3.8 %
Eosinophil #: 0.3 10*3/uL (ref 0.0–0.7)
HCT: 43.5 % (ref 40.0–52.0)
HGB: 14.7 g/dL (ref 13.0–18.0)
LYMPHS ABS: 2.2 10*3/uL (ref 1.0–3.6)
Lymphocyte %: 28.1 %
MCH: 26.3 pg (ref 26.0–34.0)
MCHC: 33.9 g/dL (ref 32.0–36.0)
MCV: 78 fL — AB (ref 80–100)
Monocyte #: 0.6 x10 3/mm (ref 0.2–1.0)
Monocyte %: 8.1 %
Neutrophil #: 4.6 10*3/uL (ref 1.4–6.5)
Neutrophil %: 59.1 %
Platelet: 200 10*3/uL (ref 150–440)
RBC: 5.59 10*6/uL (ref 4.40–5.90)
RDW: 14 % (ref 11.5–14.5)
WBC: 7.8 10*3/uL (ref 3.8–10.6)

## 2013-12-15 LAB — DRUG SCREEN, URINE
AMPHETAMINES, UR SCREEN: POSITIVE (ref ?–1000)
BARBITURATES, UR SCREEN: NEGATIVE (ref ?–200)
BENZODIAZEPINE, UR SCRN: POSITIVE (ref ?–200)
Cannabinoid 50 Ng, Ur ~~LOC~~: POSITIVE (ref ?–50)
Cocaine Metabolite,Ur ~~LOC~~: NEGATIVE (ref ?–300)
MDMA (Ecstasy)Ur Screen: NEGATIVE (ref ?–500)
Methadone, Ur Screen: NEGATIVE (ref ?–300)
OPIATE, UR SCREEN: NEGATIVE (ref ?–300)
Phencyclidine (PCP) Ur S: NEGATIVE (ref ?–25)
Tricyclic, Ur Screen: NEGATIVE (ref ?–1000)

## 2013-12-15 LAB — T4, FREE: Free Thyroxine: 0.81 ng/dL (ref 0.76–1.46)

## 2013-12-17 ENCOUNTER — Inpatient Hospital Stay: Payer: Self-pay | Admitting: Student

## 2013-12-17 LAB — BASIC METABOLIC PANEL
Anion Gap: 5 — ABNORMAL LOW (ref 7–16)
BUN: 18 mg/dL (ref 7–18)
Calcium, Total: 9.4 mg/dL (ref 8.5–10.1)
Chloride: 103 mmol/L (ref 98–107)
Co2: 27 mmol/L (ref 21–32)
Creatinine: 1.66 mg/dL — ABNORMAL HIGH (ref 0.60–1.30)
EGFR (African American): >60
EGFR (Non-African Amer.): 54 — ABNORMAL LOW
Glucose: 79 mg/dL (ref 65–99)
Osmolality: 271 (ref 275–301)
POTASSIUM: 3.7 mmol/L (ref 3.5–5.1)
Sodium: 135 mmol/L — ABNORMAL LOW (ref 136–145)

## 2013-12-17 LAB — CBC
HCT: 43 % (ref 40.0–52.0)
HGB: 14.6 g/dL (ref 13.0–18.0)
MCH: 26 pg (ref 26.0–34.0)
MCHC: 33.9 g/dL (ref 32.0–36.0)
MCV: 77 fL — ABNORMAL LOW (ref 80–100)
PLATELETS: 214 10*3/uL (ref 150–440)
RBC: 5.62 10*6/uL (ref 4.40–5.90)
RDW: 14 % (ref 11.5–14.5)
WBC: 10.4 10*3/uL (ref 3.8–10.6)

## 2013-12-17 LAB — VANCOMYCIN, TROUGH: Vancomycin, Trough: 11 ug/mL (ref 10–20)

## 2013-12-17 LAB — TROPONIN I
Troponin-I: <0.02 ng/mL
Troponin-I: <0.02 ng/mL

## 2013-12-17 LAB — DRUG SCREEN, URINE
Amphetamines, Ur Screen: POSITIVE (ref ?–1000)
Barbiturates, Ur Screen: NEGATIVE (ref ?–200)
Benzodiazepine, Ur Scrn: POSITIVE (ref ?–200)
Cannabinoid 50 Ng, Ur ~~LOC~~: POSITIVE (ref ?–50)
Cocaine Metabolite,Ur ~~LOC~~: NEGATIVE (ref ?–300)
MDMA (ECSTASY) UR SCREEN: NEGATIVE (ref ?–500)
Methadone, Ur Screen: NEGATIVE (ref ?–300)
Opiate, Ur Screen: NEGATIVE (ref ?–300)
PHENCYCLIDINE (PCP) UR S: NEGATIVE (ref ?–25)
TRICYCLIC, UR SCREEN: NEGATIVE (ref ?–1000)

## 2013-12-17 LAB — CK TOTAL AND CKMB (NOT AT ARMC)
CK, TOTAL: 175 U/L
CK, Total: 186 U/L
CK, Total: 201 U/L
CK-MB: 1.6 ng/mL (ref 0.5–3.6)
CK-MB: 1.8 ng/mL (ref 0.5–3.6)
CK-MB: 1.8 ng/mL (ref 0.5–3.6)

## 2013-12-18 LAB — CBC WITH DIFFERENTIAL/PLATELET
BASOS ABS: 0.1 10*3/uL (ref 0.0–0.1)
BASOS PCT: 1.1 %
EOS PCT: 9.4 %
Eosinophil #: 0.6 10*3/uL (ref 0.0–0.7)
HCT: 41 % (ref 40.0–52.0)
HGB: 13.7 g/dL (ref 13.0–18.0)
Lymphocyte #: 2 10*3/uL (ref 1.0–3.6)
Lymphocyte %: 34.4 %
MCH: 25.9 pg — AB (ref 26.0–34.0)
MCHC: 33.4 g/dL (ref 32.0–36.0)
MCV: 78 fL — ABNORMAL LOW (ref 80–100)
MONO ABS: 0.6 x10 3/mm (ref 0.2–1.0)
Monocyte %: 9.7 %
Neutrophil #: 2.7 10*3/uL (ref 1.4–6.5)
Neutrophil %: 45.4 %
PLATELETS: 184 10*3/uL (ref 150–440)
RBC: 5.28 10*6/uL (ref 4.40–5.90)
RDW: 14.4 % (ref 11.5–14.5)
WBC: 5.9 10*3/uL (ref 3.8–10.6)

## 2013-12-18 LAB — VANCOMYCIN, TROUGH: Vancomycin, Trough: 18 ug/mL (ref 10–20)

## 2013-12-18 LAB — BASIC METABOLIC PANEL
Anion Gap: 3 — ABNORMAL LOW (ref 7–16)
BUN: 18 mg/dL (ref 7–18)
CALCIUM: 8.7 mg/dL (ref 8.5–10.1)
CO2: 30 mmol/L (ref 21–32)
Chloride: 103 mmol/L (ref 98–107)
Creatinine: 1.52 mg/dL — ABNORMAL HIGH (ref 0.60–1.30)
GLUCOSE: 85 mg/dL (ref 65–99)
OSMOLALITY: 273 (ref 275–301)
POTASSIUM: 4.2 mmol/L (ref 3.5–5.1)
SODIUM: 136 mmol/L (ref 136–145)

## 2013-12-18 LAB — RAPID HIV-1/2 QL/CONFIRM: HIV-1/2,Rapid Ql: NEGATIVE

## 2013-12-18 LAB — MAGNESIUM: Magnesium: 2 mg/dL

## 2013-12-19 LAB — CULTURE, BLOOD (SINGLE)

## 2013-12-20 LAB — BASIC METABOLIC PANEL
Anion Gap: 3 — ABNORMAL LOW (ref 7–16)
BUN: 17 mg/dL (ref 7–18)
CO2: 30 mmol/L (ref 21–32)
CREATININE: 1.21 mg/dL (ref 0.60–1.30)
Calcium, Total: 8.9 mg/dL (ref 8.5–10.1)
Chloride: 103 mmol/L (ref 98–107)
GLUCOSE: 93 mg/dL (ref 65–99)
OSMOLALITY: 273 (ref 275–301)
Potassium: 4.4 mmol/L (ref 3.5–5.1)
Sodium: 136 mmol/L (ref 136–145)

## 2013-12-20 LAB — VANCOMYCIN, TROUGH: VANCOMYCIN, TROUGH: 12 ug/mL (ref 10–20)

## 2013-12-21 LAB — PLATELET COUNT: PLATELETS: 222 10*3/uL (ref 150–440)

## 2013-12-22 LAB — CULTURE, BLOOD (SINGLE)

## 2013-12-24 LAB — WOUND CULTURE

## 2014-01-09 LAB — CULTURE, FUNGUS WITHOUT SMEAR

## 2014-03-08 ENCOUNTER — Emergency Department: Payer: Self-pay | Admitting: General Practice

## 2014-03-10 ENCOUNTER — Emergency Department: Payer: Self-pay | Admitting: Emergency Medicine

## 2014-03-13 ENCOUNTER — Emergency Department: Payer: Self-pay | Admitting: Emergency Medicine

## 2014-03-13 LAB — COMPREHENSIVE METABOLIC PANEL
ALT: 27 U/L
AST: 56 U/L — AB (ref 15–37)
Albumin: 3.9 g/dL (ref 3.4–5.0)
Alkaline Phosphatase: 77 U/L
Anion Gap: 8 (ref 7–16)
BUN: 22 mg/dL — ABNORMAL HIGH (ref 7–18)
Bilirubin,Total: 0.7 mg/dL (ref 0.2–1.0)
CHLORIDE: 106 mmol/L (ref 98–107)
CREATININE: 1.09 mg/dL (ref 0.60–1.30)
Calcium, Total: 8.9 mg/dL (ref 8.5–10.1)
Co2: 26 mmol/L (ref 21–32)
EGFR (Non-African Amer.): >60
GLUCOSE: 74 mg/dL (ref 65–99)
Osmolality: 281 (ref 275–301)
POTASSIUM: 4.4 mmol/L (ref 3.5–5.1)
Sodium: 140 mmol/L (ref 136–145)
Total Protein: 8.2 g/dL (ref 6.4–8.2)

## 2014-03-13 LAB — DRUG SCREEN, URINE
Amphetamines, Ur Screen: POSITIVE (ref ?–1000)
BARBITURATES, UR SCREEN: NEGATIVE (ref ?–200)
Benzodiazepine, Ur Scrn: POSITIVE (ref ?–200)
Cannabinoid 50 Ng, Ur ~~LOC~~: POSITIVE (ref ?–50)
Cocaine Metabolite,Ur ~~LOC~~: POSITIVE (ref ?–300)
MDMA (ECSTASY) UR SCREEN: NEGATIVE (ref ?–500)
Methadone, Ur Screen: NEGATIVE (ref ?–300)
OPIATE, UR SCREEN: POSITIVE (ref ?–300)
Phencyclidine (PCP) Ur S: NEGATIVE (ref ?–25)
Tricyclic, Ur Screen: NEGATIVE (ref ?–1000)

## 2014-03-13 LAB — URINALYSIS, COMPLETE
Bacteria: NONE SEEN
Bilirubin,UR: NEGATIVE
Blood: NEGATIVE
Glucose,UR: NEGATIVE mg/dL (ref 0–75)
KETONE: NEGATIVE
Leukocyte Esterase: NEGATIVE
Nitrite: NEGATIVE
PH: 6 (ref 4.5–8.0)
Protein: NEGATIVE
Specific Gravity: 1.017 (ref 1.003–1.030)

## 2014-03-13 LAB — CBC
HCT: 39.4 % — AB (ref 40.0–52.0)
HGB: 13.1 g/dL (ref 13.0–18.0)
MCH: 27.1 pg (ref 26.0–34.0)
MCHC: 33.3 g/dL (ref 32.0–36.0)
MCV: 81 fL (ref 80–100)
Platelet: 305 10*3/uL (ref 150–440)
RBC: 4.85 10*6/uL (ref 4.40–5.90)
RDW: 13.9 % (ref 11.5–14.5)
WBC: 9 10*3/uL (ref 3.8–10.6)

## 2014-03-13 LAB — SALICYLATE LEVEL: Salicylates, Serum: 2.5 mg/dL

## 2014-03-13 LAB — ETHANOL: Ethanol %: <0.003 % (ref 0.000–0.080)

## 2014-03-13 LAB — ACETAMINOPHEN LEVEL

## 2014-06-19 ENCOUNTER — Emergency Department: Payer: Self-pay | Admitting: Emergency Medicine

## 2014-06-19 LAB — CBC
HCT: 44.2 % (ref 40.0–52.0)
HGB: 14.7 g/dL (ref 13.0–18.0)
MCH: 26.7 pg (ref 26.0–34.0)
MCHC: 33.2 g/dL (ref 32.0–36.0)
MCV: 80 fL (ref 80–100)
Platelet: 271 10*3/uL (ref 150–440)
RBC: 5.5 10*6/uL (ref 4.40–5.90)
RDW: 14.2 % (ref 11.5–14.5)
WBC: 8.3 10*3/uL (ref 3.8–10.6)

## 2014-06-19 LAB — COMPREHENSIVE METABOLIC PANEL
ALBUMIN: 3.7 g/dL (ref 3.4–5.0)
ALT: 27 U/L
AST: 21 U/L (ref 15–37)
Alkaline Phosphatase: 87 U/L
Anion Gap: 5 — ABNORMAL LOW (ref 7–16)
BUN: 12 mg/dL (ref 7–18)
Bilirubin,Total: 0.2 mg/dL (ref 0.2–1.0)
CALCIUM: 8.4 mg/dL — AB (ref 8.5–10.1)
CHLORIDE: 107 mmol/L (ref 98–107)
CO2: 28 mmol/L (ref 21–32)
Creatinine: 0.97 mg/dL (ref 0.60–1.30)
EGFR (African American): >60
EGFR (Non-African Amer.): >60
Glucose: 91 mg/dL (ref 65–99)
Osmolality: 279 (ref 275–301)
POTASSIUM: 4.2 mmol/L (ref 3.5–5.1)
SODIUM: 140 mmol/L (ref 136–145)
TOTAL PROTEIN: 7.8 g/dL (ref 6.4–8.2)

## 2014-06-19 LAB — URINALYSIS, COMPLETE
BLOOD: NEGATIVE
Bilirubin,UR: NEGATIVE
Glucose,UR: NEGATIVE mg/dL (ref 0–75)
Ketone: NEGATIVE
LEUKOCYTE ESTERASE: NEGATIVE
Nitrite: NEGATIVE
Ph: 6 (ref 4.5–8.0)
Protein: NEGATIVE
RBC,UR: 5 /HPF (ref 0–5)
Specific Gravity: 1.005 (ref 1.003–1.030)
Squamous Epithelial: NONE SEEN
WBC UR: 2 /HPF (ref 0–5)

## 2014-06-19 LAB — DRUG SCREEN, URINE
Amphetamines, Ur Screen: POSITIVE (ref ?–1000)
BENZODIAZEPINE, UR SCRN: POSITIVE (ref ?–200)
Barbiturates, Ur Screen: NEGATIVE (ref ?–200)
CANNABINOID 50 NG, UR ~~LOC~~: POSITIVE (ref ?–50)
Cocaine Metabolite,Ur ~~LOC~~: NEGATIVE (ref ?–300)
MDMA (Ecstasy)Ur Screen: NEGATIVE (ref ?–500)
METHADONE, UR SCREEN: NEGATIVE (ref ?–300)
OPIATE, UR SCREEN: NEGATIVE (ref ?–300)
Phencyclidine (PCP) Ur S: NEGATIVE (ref ?–25)
TRICYCLIC, UR SCREEN: NEGATIVE (ref ?–1000)

## 2014-06-19 LAB — SALICYLATE LEVEL: SALICYLATES, SERUM: 2.3 mg/dL

## 2014-06-19 LAB — ETHANOL: Ethanol: <3 mg/dL

## 2014-06-19 LAB — ACETAMINOPHEN LEVEL: Acetaminophen: <2 ug/mL

## 2014-07-23 ENCOUNTER — Emergency Department: Payer: Self-pay | Admitting: Emergency Medicine

## 2014-10-10 ENCOUNTER — Emergency Department: Payer: Self-pay | Admitting: Emergency Medicine

## 2014-10-13 ENCOUNTER — Emergency Department: Payer: Self-pay | Admitting: Physician Assistant

## 2014-11-16 NOTE — Discharge Summary (Signed)
PATIENT NAME:  Justin Kelley, [name] MR#:  [id number] DATE OF BIRTH:  November 16, 1985  DATE OF ADMISSION:  11/14/2013 DATE OF DISCHARGE:  11/19/2013  ADMISSION DIAGNOSIS:  1.  Cellulitis and abscess of the right wrist.   DISCHARGE DIAGNOSES: 1.  Cellulitis and abscess of the right wrist status post incision and drainage.  2.  Sepsis.  3.  Hypokalemia.  4.  Depression/attention deficit hyperactivity disorder.  5.  Chronic pain with history of narcotic abuse.   CONSULTATIONS: Dr. Ernest PineHooten.   PERTINENT LABORATORIES AT DISCHARGE: Sodium 136, potassium 4.0, chloride 104, bicarb 27, BUN 5, creatinine 0.90. White blood cells 8.3, hemoglobin 13.4, hematocrit 41. Platelets are 307.   Cultures from the wound: Actually, I called microbiology and they said that there were no bacteria growing as a culprit.   HOSPITAL COURSE: This is a 29 year old male with a history of substance abuse, presented with right wrist pain; found to have cellulitis and abscess. For further details, please refer to the H and P.  1.  Sepsis due to cellulitis. I called microbiology. Since the patient had already been on antibiotics prior to the I and D, it looks like there is no organism seen on the culture plate. The patient does have a history of MRSA, so will be discharged on Bactrim and clindamycin. He was currently on vancomycin and Zosyn. He was continued on pain control. We appreciate consultation from Dr. Ernest PineHooten in orthopedic surgery. He underwent I and D on the 25th of April. Dr. Ernest PineHooten recommends wound care consult as an outpatient, for which the order was written prior to discharge.  2.  Cellulitis, status post incision and drainage, postoperative day #3. Plan as stated above.  3. Hypokalemia, repleted.  4.  Depression/attention deficit hyperactivity disorder. The patient is continued on his outpatient medication.  5. Chronic pain with narcotic abuse history. The patient does exhibit some drug seeking behavior while in the  hospital.  6.  Tobacco dependence. The patient was placed on a nicotine patch here. He was encouraged to stop smoking. He was counseled by myself for 4 minutes.    DISCHARGE MEDICATIONS: The patient will continue his:  1.  Clindamycin 300 mg p.o. q.8 hours x 10 days.  2.  Xanax 2 mg t.i.d.  3.  Adderall 30 mg daily.  4.  Docusate 100 mg t.i.d. p.r.n.  5.  Nicotine patch 14 mg per 24 hours.  6.  Bactrim 1 tablet b.i.d. x 10 days.  7.  Dilaudid 2 mg q.6 hours p.r.n.   DISCHARGE DRESSING: Keep dressing dry.   DISCHARGE DIET: Regular diet.   DISCHARGE REFERRAL: Wound care clinic.   DISCHARGE FOLLOWUP:  The patient will follow up on Friday with Dr. Ernest PineHooten.   TIME SPENT:  35 minutes.  The patient was medically stable for discharge.    ____________________________ Mackie Holness P. Juliene PinaMody, MD spm:dmm D: 11/19/2013 11:20:00 ET T: 11/19/2013 11:37:59 ET JOB#: [id number]  cc: Kayin Osment P. Juliene PinaMody, MD, <Dictator> Illene LabradorJames P. Angie FavaHooten Jr., MD Patricia PesaSITAL P Harvy Riera MD ELECTRONICALLY SIGNED 11/19/2013 12:12

## 2014-11-16 NOTE — Consult Note (Signed)
Brief Consult Note: Diagnosis: Recurrent abscesses.   Patient was seen by consultant.   Consult note dictated.   Recommend further assessment or treatment.   Orders entered.   Comments: Rec I and D of arm lesion and send for routine cx as well as AFB and Fungal cx Spoke with Dr Excell Seltzerooper who suggested he be seen by Dr Ernest PineHooten.  Electronic Signatures: Dierdre HarnessFitzgerald, Mcdonald Reiling Patrick (MD)  (Signed 26-May-15 17:01)  Authored: Brief Consult Note   Last Updated: 26-May-15 17:01 by Dierdre HarnessFitzgerald, Larae Caison Patrick (MD)

## 2014-11-16 NOTE — Consult Note (Signed)
PATIENT NAME:  Justin Kelley, Justin Kelley MR#:  [id number] DATE OF BIRTH:  1985/10/22  DATE OF CONSULTATION:  11/15/2013  REFERRING PHYSICIAN:  Dr. Mordecai MaesSanchez CONSULTING PHYSICIAN:  Illene LabradorJames P. Angie FavaHooten Jr., MD  CHIEF COMPLAINT:  Right arm and wrist swelling.   HISTORY OF PRESENT ILLNESS:  The patient is a 29 year old right-hand dominant male who reports a three day history of swelling, redness, and pain to the right wrist.  He recalls striking the dorsum of the wrist against a wall while carrying items.  He noted a gradual increase in terms of swelling with associated erythema.  [facility] and received IV clindamycin.  He returned today with what he described as increased pain and swelling of the hand with erythema.  He reports some subjective fevers.  He denies any night sweats.   PAST MEDICAL HISTORY:  ADHD, substance abuse, multiple soft tissue abscesses (MRSA).   ALLERGIES:  No known drug allergies.   MEDICATIONS:  Tylenol 650 mg q. 4 hours as needed, Colace 100 mg twice daily, heparin 5000 units subcutaneous q. 8 hours, Zofran 4 mg IV q. 4 hours, alprazolam 2 mg q. 8 hours, Adderall 30 mg q.a.[name], Dilaudid 1 to 2 mg IV q. 3 hours as needed pain, Toradol 30 mg IV q. 6 hours, Zosyn 4.5 grams IV piggyback q. 8 hours, vancomycin 1250 mg IV q. 8 hours.   REVIEW OF SYSTEMS:  Positive for fevers and previous chills.  Pertinent musculoskeletal review of systems is positive for swelling and palpable pain and erythema to the dorsum of the forearm and wrist.  No significant swelling to the digits.  No gross numbness.  No appreciable adenopathy.   PHYSICAL EXAMINATION: GENERAL:  The patient is a well-developed, well-nourished, talkative male seen in no acute distress.  RIGHT UPPER EXTREMITY:  No palpable axillary nodes.  Examination of the right forearm and wrist demonstrates some soft tissue swelling along the forearm with mild erythema.  Previous demarcation line is noted with  obvious decrease in the amount of previously determined erythema.  There is localized area of swelling along the dorsal and ulnar aspect of the wrist.  Scant serous fluid is noted.  Tenderness to palpation about the site is noted.  Good range of motion of the digits is appreciated.  Guarding is noted with attempted active range of motion of the wrist.  Good capillary refill.  Sensory function is intact.  Good range of motion of the elbow is also noted.   LABORATORY DATA:  Blood cultures are no growth thus far.  CBC obtained yesterday is notable for a white count of 20,000.   [facility]14/2013 was reviewed.  Diffuse edema is noted throughout the fatty tissue of the right wrist.  No definitive fluid collection to suggest abscess.  There is noted to be in an ununited fracture of the scaphoid.  No cortical erosion to suggest osteomyelitis.  No foreign body or soft tissue gas collection is appreciated.   IMPRESSION:  Cellulitis to the right wrist and forearm.   PLAN:  Findings were discussed in detail with the patient.  The swelling and erythema appears to be resolving with antibiotics.  A bulky soft tissue compressive wrap was placed to the forearm and hand.  The patient instructed on elevation of the hand at all times.  We will plan to re-evaluate the hand and wrist tomorrow.  At the present time, I would like to see how  the symptoms improve.  If there continues to be localization or increased symptoms about the dorsal and ulnar aspect of the wrist, possible need for incision, irrigation, and debridement will be considered.    ____________________________ Illene Labrador. Angie Fava., MD jph:ea D: 11/15/2013 20:13:06 ET T: 11/16/2013 00:34:44 ET JOB#: [id number]  cc: Illene Labrador. Angie Fava., MD, <Dictator> JAMES P Angie Fava MD ELECTRONICALLY SIGNED 11/17/2013 18:20

## 2014-11-16 NOTE — Consult Note (Signed)
Brief Consult Note: Diagnosis: polysubstance abuse.   Patient was seen by consultant.   Consult note dictated.   Discussed with Attending MD.   Comments: Psychiatry: PAtient seen and chart reviewed. Patient appears to have a polysubstance abuse problem but is not acutely dangerous in a way to justify commitment. Offered him advise of voluntaary admit to [facility] which he refused. RElease from IVC and follow up outpt psychiatry.  Electronic Signatures: Audery Amellapacs, Flornce Record T (MD)  (Signed 20-Aug-15 15:40)  Authored: Brief Consult Note   Last Updated: 20-Aug-15 15:40 by Audery Amellapacs, Izick Gasbarro T (MD)

## 2014-11-16 NOTE — Discharge Summary (Signed)
PATIENT NAME:  Justin Kelley, Justin Kelley MR#:  [id number] DATE OF BIRTH:  24-Feb-1986  DATE OF ADMISSION:  12/17/2013 DATE OF DISCHARGE:  12/21/2013  CONSULTANTS:   1.  Dr. Sampson GoonFitzgerald from infectious disease.  2.  Dr. Martha ClanKrasinski from orthopedics.   PRIMARY CARE PHYSICIAN:  Dr. Lacie ScottsNiemeyer.   CHIEF COMPLAINT:  Right arm swelling and pain.   DISCHARGE DIAGNOSES: 1.  Right upper extremity cellulitis with abscess, status post incision and drainage, by orthopedics.  2.  Acute renal failure, resolved.  3.  History of substance abuse in the past.  4.  History of recurrent cellulitis.  5.  Opiate dependence.  6.  Anxiety.  7.  Depression.  8.  Attention deficit/hyperactivity disorder.  9.  Tobacco abuse.   DISCHARGE MEDICATIONS:  Cipro 500 mg every 12 hours for eight days, Bactrim double strength 800/160 mg two  times a day for eight days, Adderall 30 mg once a day, alprazolam 2 mg 1 tab 3 times a day.   DIET:  Regular.   ACTIVITY:  As tolerated.   FOLLOWUP:  Please follow with orthopedics for suture removal in 1 to 2 weeks.  Please follow with primary care physician within 1 to 2 weeks if drainage recurs, redness worsens or you have fevers or any other issues, call your doctor right away.   DISPOSITION:  Home.   SIGNIFICANT LABORATORY AND IMAGING:  Initial BUN 18, creatinine 1.66, last creatinine of 1.21.  Troponins negative x 3.  U-tox positive for amphetamines, benzodiazepines and cannabinoids.  White count of 10.4.  Blood cultures on arrival, no growth to date.  Wound cultures from the site taken during surgery shows moderate WBCs, a few gram-positive cocci, a few gram-positive rods, a few gram-negative rods, but have had no significant growth.  No yeast or fungal isolated in 48 hours.  HIV negative.  Ultrasound right upper extremity shows nonspecific ill-defined hypoechoic structure 1.7 x 0.6 x 1.8 cm within the subQ fat of the radial forearm.   HISTORY OF PRESENT ILLNESS AND HOSPITAL COURSE:  For  full details of H and P, please see the dictation on May 25 by Dr. Elisabeth PigeonVachhani, but briefly this is a 29 year old male with history of ADHD, anxiety, depression, recurrent infections, history of MRSA in the past who came in with right wrist pain and some abscess which was drained on April 22nd.  He came in to the hospital, at that time was discharged on Bactrim.  He finished a course, but came back to this hospital on May 22nd, got discharged the day prior to admission at this time around again with Bactrim.  He came back because he said that the pain was worse and there was swelling and redness that worsened.  The patient was noted to have an abscess.  Orthopedics and infectious disease was consulted and the patient was taken to the OR by Dr. Martha ClanKrasinski on May 27 and the small abscess was drained.  The patient did have evidence for cellulitis and abscess.  The cultures at this point have shown no specific organism, but the polymicrobial as dictated above.  The patient has been followed by Dr. Sampson GoonFitzgerald from infectious disease who at this point recommends Cipro and Bactrim for another eight days.  The patient was also given Hibiclens and Bactroban nasal ointment and he is to follow with Dr. Martha ClanKrasinski for suture removal.  His acute renal failure has resolved.  He does not have any significant pain, fevers or significant leukocytosis.  At this point,  he will be discharged with outpatient follow-up.    PHYSICAL EXAMINATION:  VITAL SIGNS:  On the day of discharge, temperature is 98.3, pulse is 75, respiratory rate 18, blood pressure 139/78, O2 sat 94%.  GENERAL:  The patient is an obese male sitting at the edge of bed, no obvious distress.  HEENT:  Normocephalic, atraumatic.  Pupils are equal.  HEART:  Normal S1, S2.  LUNGS:  Clear.   EXTREMITIES:  Right upper extremity, about 1-1/2 inch area on the right forearm, very mild redness without significant drainage.  Sutures intact.  Wound healing nicely.   Total time  spent 32 minutes.   CODE STATUS:  THE PATIENT IS A FULL CODE.    ____________________________ Krystal Eaton, MD sa:ea D: 12/21/2013 14:54:43 ET T: 12/22/2013 00:58:34 ET JOB#: [id number]  cc: Krystal Eaton, MD, <Dictator> Meindert A. Lacie Scotts, MD Kathreen Devoid, MD Stann Mainland. Sampson Goon, MD Krystal Eaton MD ELECTRONICALLY SIGNED 01/03/2014 10:56

## 2014-11-16 NOTE — H&P (Signed)
PATIENT NAME:  CHRLES, SELLEY MR#:  [id number] DATE OF BIRTH:  [birth year]  DATE OF ADMISSION:  12/14/2013  PRIMARY CARE PHYSICIAN: Dr. Lacie Scotts.   REFERRING PHYSICIAN: Dr. Darnelle Catalan.   CHIEF COMPLAINT: Right hand swelling and redness.   HISTORY OF PRESENT ILLNESS: The patient is a 29 year old Caucasian male with past medical history of ADHD, anxiety, depression, migraine headaches, history of multiple abscesses in the past with a history of MRSA and a history of opiate abuse currently following with the pain management clinic and on Subutex. He is presenting to the ER with a chief complaint of a 2-day history of right hand swelling, redness, and pain. The patient was just recently admitted to the hospital with similar complaint of right wrist pain on 04/22. At that time, the patient was diagnosed with sepsis and cellulitis and he was started on Zosyn and vancomycin. The patient's abscess was eventually drained during the hospital course. The patient was discharged home and he has completed the antibiotic course, and abscess was almost completely resolved. Yesterday he noticed some redness on the dorsum of the hand associated with pain and swelling. He has tried his old p.o. antibiotics clindamycin, which was given by the ER prior to his hospital admission regarding the cellulitis. The patient tried taking clindamycin with no significant improvement. By today morning, the redness has gotten worse with red streaks climbing up to his right forearm. The patient did not spike any fevers. Came into the ER, blood cultures were obtained and patient was started on Zosyn and vancomycin. He is resting comfortably during my examination. Urine drug screen was pending at this time. Denies taking any IV illicit drugs. Denies any chest pain or shortness of breath. His girlfriend is at bedside. Denies any other complaints. The patient's TSH is elevated at 5.12. The patient is reporting that approximately 2 months ago,  primary care physician had checked his thyroid which was normal at that time. He denies having any thyroid problems.   PAST MEDICAL HISTORY: migraine headaches, anxiety, depression, history of multiple abscesses, and folliculitis, history of MRSA.   PAST SURGICAL HISTORY: Status post drainage of the abscesses.   ALLERGIES: No known drug allergies.   PSYCHOSOCIAL HISTORY: Lives at home with girlfriend. Smokes less than half pack a day. Occasional intake of alcohol. Denies taking any illicit drugs recently.  REVIEW OF SYSTEMS:  CONSTITUTIONAL: Denies any fever or fatigue. Denies any weight loss or weight gain.  EYES: Denies blurry vision, double vision, glaucoma or cataracts.  ENT: Denies epistaxis, discharge, snoring.  RESPIRATORY: Denies cough, COPD.  CARDIOVASCULAR: No chest pain, palpitations, syncope.  GASTROINTESTINAL: Denies nausea, vomiting, diarrhea. No hematemesis or melena.  GENITOURINARY: No dysuria or hematuria. ENDOCRINE: Denies polyuria, nocturia, thyroid problems. Recent thyroid workup was normal approximately 2 months ago.  HEMATOLOGIC AND LYMPHATIC: No anemia, easy bruising, or bleeding.  INTEGUMENTARY: No acne, rash, lesions other than a history of multiple abscesses status post drainage.  MUSCULOSKELETAL: No joint pain in the neck and back, denies any shoulder pain. Denies gout.  NEUROLOGIC: Denies vertigo, ataxia, dementia, headaches.   PSYCHIATRIC: No ADD or OCD. Has history of anxiety and depression.   PHYSICAL EXAMINATION:  VITAL SIGNS: Temperature 98.5 pulse 118, respirations 18, blood pressure 153/89, pulse oximetry 93% on room air.  GENERAL APPEARANCE: Not in any acute distress. Moderately built and nourished.  HEENT: Normocephalic, atraumatic. Pupils are equally reacting to light and accommodation. No scleral icterus. No conjunctival injection. No sinus tenderness. No pulse nasal drip. Moist  mucous membranes.  NECK: Supple. No JVD. No thyromegaly. Range of  motion is intact.  LUNGS: Clear to auscultation bilaterally. No accessory muscle usage. There is no anterior chest wall tenderness on palpation.  CARDIAC: S1 and S2 normal. Regular rate and rhythm and tachycardic.  GASTROINTESTINAL: Soft. Bowel sounds are positive in all 4 quadrants. Nontender, nondistended. No hepatosplenomegaly. No masses felt.  NEUROLOGIC: Awake, alert, oriented x 3. Motor and sensory are grossly intact. Reflexes are 2+.  EXTREMITIES: No edema. No cyanosis. No clubbing. Right upper extremity: The dorsum of the right hand is erythematous, tender, and edematous. No fluctuance. No open wounds were noticed. Red streaks were noticed going up the right upper extremity. No open wounds were noticed.   LABORATORY AND IMAGING STUDIES: Troponin less than 0.02, TSH is 5.12. WBC 10.1, hemoglobin and hematocrit are normal. Platelet count is at 207,000. MCV is 78. Few LFTs are elevated. The rest of the LFTs are normal. Chem-8 is normal except anion gap at 6.   Portable chest x-ray: No active cardiopulmonary process seen.   ASSESSMENT AND PLAN: A 29 year old Caucasian male presenting to the ER with a chief complaint of right hand redness, swelling, and pain will be admitted with the following assessment and plan: 1. Right upper extremity cellulitis with history of methicillin-resistant Staphylococcus aureus. The patient has no leukocytosis. Denies any fever, but the patient is tachycardic. We will provide him IV Rocephin and vancomycin. We will provide him IV Toradol for pain management.  2. History of opiate abuse. Follow up with pain management clinic as an outpatient. We will continue his home medication Subutex.  3. History of anxiety and depression. Continue his home medication alprazolam.  4. Attention deficit hyperactivity disorder, continue Adderall. 5. Elevated thyroid-stimulating hormone- check  free T4 and T3 levels. The patient is reporting that his thyroid tests were normal  approximately 2 months ago. 6. Nicotine dependence. We will provide nicotine patch and provide counseling in a.m.  7. Will provide gastrointestinal and deep vein thrombosis prophylaxis. Urine drug screen is pending at this time.   CODE STATUS: He is full code.   Diagnosis and plan of care was discussed in detail with the patient at bedside. He verbalized understanding of the plan.  TOTAL TIME SPENT: 50 minutes.    ____________________________ Ramonita LabAruna Kion Huntsberry, MD ag:lt/am D: 12/15/2013 00:21:47 ET T: 12/15/2013 02:01:00 ET JOB#: [id number]  cc: Ramonita LabAruna Camry Robello, MD, <Dictator> Meindert A. Lacie ScottsNiemeyer, MD  Ramonita LabARUNA Starlyn Droge MD ELECTRONICALLY SIGNED 12/27/2013 7:55

## 2014-11-16 NOTE — Consult Note (Signed)
PATIENT NAME:  Justin Kelley, Justin Kelley MR#:  [id number] DATE OF BIRTH:  1985-11-06  DATE OF CONSULTATION:  12/18/2013  INFECTIOUS DISEASE CONSULTATION  REFERRING PHYSICIAN:  Dr. Elisabeth Pigeon CONSULTING PHYSICIAN:  Stann Mainland. Sampson Goon, MD  REASON FOR CONSULTATION:  Recurrent cellulitis.   HISTORY OF PRESENT ILLNESS: This is a pleasant 29 year old gentleman with history of prior drug use and attention ADHD, as well as recurrent boils. He was recently admitted in April and had an I and D of a wrist/ulnar abscess. This grew viridans strep and coag-negative staph. He was discharged at that time after IV antibiotics on clindamycin and Bactrim. This for a 10-day course from April 27. He did relatively well and says that lesion healed up prior.  From May 22 he was readmitted with infection at a different site on his forearm. He had had stated that he developed the swelling over his forearm several days prior to his admission on May 22. The patient at that time had tried some clindamycin he had at home, but it did not help. He reported the redness progressed and he got streaking up his arm. The patient absolutely denies that he has been doing any injection of IV drugs or skin popping. When he was admitted initially May 22, he had no fevers, chills or night sweats. He was discharged home May 24 on Bactrim, but this progressed over 24 hours and he was readmitted May 25.   The patient reports not having an HIV test in a long time. He says his mother does have recurrent boils and he lives with her.   PAST MEDICAL HISTORY: 1.  Anxiety, ADD, drug abuse and prior opiate dependence with a history of IV drug use but none for several years. He does report he has had infections at injectable sites. 2.  Depression.  3.  Migraine.  4.  MRSA in the past, especially in his back and shoulder.   PAST SURGICAL HISTORY: Drainage of multiple abscesses.   ALLERGIES: No known drug allergies.   SOCIAL HISTORY: He lives with his  mother. She smokes less than a pack a day, occasional alcohol. He is on Suboxone for his narcotic dependence but denies any recent IV drug use.  REVIEW OF SYSTEMS: Eleven systems reviewed and negative except as per HPI.   ANTIBIOTICS SINCE ADMISSION:  Include vancomycin begun May 24, meropenem begun May 24.   PHYSICAL EXAMINATION: VITAL SIGNS: Temperature 97.6, pulse 75, blood pressure 143/92, respirations 20, sat 96% on room air.  GENERAL: He is pleasant, interactive, in no acute distress, lying in bed.  HEENT: Pupils equal, round and reactive to light and accommodation. Extraocular movements are intact. Sclerae anicteric. Oropharynx clear. No thrush. No lesions.  NECK: Supple.  HEART: Regular.  LUNGS: Clear to auscultation bilaterally.  ABDOMEN: Soft, nontender, nondistended. No hepatosplenomegaly.  EXTREMITIES: In his right forearm he has a discrete area of induration, tenderness approximately 2 x 3 cm. There is some overlying warmth and redness. There is no streaking noted.  NEUROLOGIC: He is alert and oriented x 3. Grossly nonfocal neuro exam.   DATA: Blood cultures April 21 negative x 2. Drainage culture from an abscess April 25 grew moderate growth of viridans strep and rare coag-negative staph. No sensitivities available. Blood culture May 22 x 2 negative. May 25 x 2 negative. White blood count on admission was 10.1, currently 10.4, hemoglobin 14.6, platelets 214. Renal function is elevated some with a creatinine of 1.66; whereas prior had been 1.18. AST slightly elevated at  43. Drug screen is positive for amphetamines, benzos, cannabinoids but negative for opiates.  IMAGING:  Ultrasound of his right arm shows nonspecific ill-defined hypoechoic structure within the subcutaneous fat of the radial forearm which may reflect a hematoma. No drainable fluid collection is noted. Chest x-ray shows a PICC in the lower superior vena cava. Chest x-ray shows no acute abnormalities.   IMPRESSION: A  29 year old with a history of prior drug use admitted with recurrent cellulitis and abscess. Given the history, it would suggest drug injection induced recurrent abscesses, although he adamantly denies having used any drugs over the last several years. Hs initial culture grew viridan streptococci and coagulase-negative staphylococci which are skin flora, but could certainly have caused the infection. 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   It does not appear there is much to drain at this point, which makes it difficult to have cultures. Other blood cultures have all been negative.   RECOMMENDATIONS: 1.  Discontinue meropenem.  2.  Continue vancomycin with close monitoring of his renal function.  3.  If the site worsens, would suggest surgical consults for I and D.  - would send for routine cx and afb and fungal 4.  At this point, since he has the PICC, he probably would benefit from a 10-day course of IV antibiotics with vancomycin. but hesitant to dc a prior IVDU with a picc line in place  Thank you for the consult. I will be glad to follow with you.    ____________________________ Stann Mainlandavid P. Sampson GoonFitzgerald, MD dpf:ce D: 12/18/2013 16:39:58 ET T: 12/18/2013 17:20:49 ET JOB#: [id number]  cc: Stann Mainlandavid P. Sampson GoonFitzgerald, MD, <Dictator> Linday Rhodes Sampson GoonFITZGERALD MD ELECTRONICALLY SIGNED 12/18/2013 21:04

## 2014-11-16 NOTE — Op Note (Signed)
PATIENT NAME:  Justin Kelley, Justin Kelley MR#:  [id number] DATE OF BIRTH:  1985/11/13  DATE OF PROCEDURE:  12/19/2013  PREOPERATIVE DIAGNOSES:  Right forearm abscess.   POSTOPERATIVE DIAGNOSES:  Right forearm abscess.  PROCEDURE:  Incision and drainage, right forearm abscess.   ANESTHESIA:  General.   SURGEON:  Juanell FairlyKevin Christepher Melchior, Kelley.D.   SPECIMENS:  Culture swab x 2 sent to microbiology.   ESTIMATED BLOOD LOSS:  Minimal.   COMPLICATIONS:  None.   INDICATION FOR THE PROCEDURE:  The patient is a 29 year old male who presents with a dorsal radial forearm swelling which is erythematous and tender.  The patient has had a history of previous infections, the last one was over the ulnar aspect of the right wrist which was drained in April of 2015.  Given the high suspicion for infection, the decision was made to perform an incision and drainage.   PROCEDURE:  The patient was marked with the word yes over the right arm.  He was brought to the Operating Room where he was placed supine on the operative table.  He underwent general anesthesia.  He was prepped and draped in a sterile fashion.  A timeout was performed to verify the patient's name, date of birth, medical record number, correct site of surgery and correct procedure to be performed.  The patient had already received antibiotics on the floor earlier in the day.  He had received vancomycin.   A longitudinal 20 mm incision was made directly over of the area of swelling over the dorsal radial forearm.  Purulent fluid was drained and cultured with culture swabs.  One was sent for aerobic and anaerobic and the second was sent for AFB cultures.  The wound was then copiously irrigated with 500 mL of normal saline and 1000 mL of GU impregnated saline.  The wound was loosely approximated with 4-0 nylon with two limbs of a quarter inch Penrose drain left in place.  A dry sterile dressing was applied.  The patient was awakened and brought to the PACU in stable  condition.  I was scrubbed and present for the entire case and all sharp and instrument counts were correct at the conclusion of the case.  I spoke with the patient's mother by phone in the recovery room to let her know the patient had done well with surgery and there was no complications.  I let her know he was stable in the recovery room.     ____________________________ Kathreen DevoidKevin L. Ridhaan Dreibelbis, MD klk:ea D: 12/19/2013 18:36:05 ET T: 12/20/2013 03:51:10 ET JOB#: [id number]  cc: Kathreen DevoidKevin L. Chancey Ringel, MD, <Dictator> Kathreen DevoidKEVIN L Quiara Killian MD ELECTRONICALLY SIGNED 12/25/2013 23:16

## 2014-11-16 NOTE — H&P (Signed)
PATIENT NAME:  Justin Kelley, Venus M MR#:  [id number] DATE OF BIRTH:  July 17, 1986  DATE OF ADMISSION:  11/14/2013  REFERRING PHYSICIAN:  Physician assistant Sherrell PullerKate Andrukonis.  PRIMARY CARE PHYSICIAN:  Dr. Lacie ScottsNiemeyer.   CHIEF COMPLAINT:  Right wrist pain.   HISTORY OF PRESENT ILLNESS:  A 29 year old Caucasian gentleman with history of substance abuse presenting with right wrist pain.  He describes three day duration of right wrist pain after "hitting the wall" with his wrist.  No lacerations; however, he has noted swelling, edema, erythema, warmth as well as subjective fevers and chills.  With the above symptoms presented to the Emergency Department one day prior to admission, given clindamycin via IV and discharged on by mouth clindamycin; however, his symptoms have subsequently worsened, thus he re-presented to the hospital.  Currently complaining only of pain at rest, which is sharp, nonradiating, at the moment 4 to 5 out of 10 in intensity, worsened with movement.  No relieving factors.  Otherwise, no further complaints.   REVIEW OF SYSTEMS:  CONSTITUTIONAL:  Positive for subjective fevers, chills.  EYES:  No blurry vision, double vision, eye pain.  EARS, NOSE, THROAT:  Denies tinnitus, ear pain, hearing loss.  RESPIRATORY:  Denies cough, wheeze, shortness of breath.  CARDIOVASCULAR:  Denies chest pain, palpitations, edema.  GASTROINTESTINAL:  Denies nausea, vomiting, diarrhea.  GENITOURINARY:  Denies dysuria, hematuria.  ENDOCRINE:  Denies nocturia, thyroid problems. HEMATOLOGY AND LYMPHATIC:  Denies easy bruising or bleeding.  SKIN:  Positive for erythematous lesion on the wrist as described above.  MUSCULOSKELETAL:  Positive for pain in right wrist.  Otherwise denies any neck, back, shoulder, knees, hip pain.  NEUROLOGIC:  Denies paralysis, paresthesias.  PSYCHIATRIC:  Denies anxiety or depression symptoms.  Otherwise, full review of systems performed by me is negative.   PAST MEDICAL  HISTORY:  ADHD as well as history of substance abuse.  Otherwise, no significant medical history.   SOCIAL HISTORY:  Positive for tobacco use.  Denies any alcohol usage.  Positive for marijuana usage.  Denies any current IV drug abuse.   ALLERGIES:  No known drug allergies.   HOME MEDICATIONS:  Include Percocet 5/325 mg 1 tablet every six hours as needed for pain, Klonopin 2 mg 3 times daily, Adderall which he has not been taking.  He is discharged on clindamycin 300 mg by mouth q. 6 hours.   PHYSICAL EXAMINATION: VITAL SIGNS:  Temperature 100.2 degrees Fahrenheit, heart rate 112, respirations 18, blood pressure 168/83, saturating 96% on room air.  Weight 109.8 kg, BMI 32.9.  GENERAL:  Well-nourished, well-developed, Caucasian gentleman currently in no acute distress.  HEAD:  Normocephalic, atraumatic.  EYES:  Pupils equal, round, reactive to light.  Extraocular muscles intact.  No scleral icterus.  MOUTH:  Moist mucosal membranes.  Dentition intact.  No abscess noted.  EAR, NOSE, THROAT:  Throat clear without exudates.  No external lesions.  NECK:  Supple.  No thyromegaly.  No nodules.  No JVD.  PULMONARY:  Clear to auscultation bilaterally without wheezes, rales or rhonchi.  No use of accessory muscles.  Good respiratory effort.  CHEST:  Nontender to palpation.  CARDIOVASCULAR:  S1, S2, tachycardic.  No murmurs, rubs, or gallops.  No edema.  Pedal pulses 2+ bilaterally.  GASTROINTESTINAL:  Soft, nontender, nondistended.  No masses.  Positive bowel sounds.  No hepatosplenomegaly.  MUSCULOSKELETAL:  Right wrist with swelling, erythema, edema which is tender to palpation, however full range of motion in all extremities.  There is no  clubbing noted.  NEUROLOGICAL:  Cranial nerves II through XII intact.  No gross focal neurological deficits.  Sensation intact.  Reflexes intact.  SKIN:  Diffuse erythematous lesion which is warm to touch, located over the medial portion of the right wrist.  No  further lesions, rashes, cyanosis.  Skin warm, dry.  Turgor intact.  PSYCHIATRIC:  Mood and affect within normal limits.  The patient is awake, alert and oriented x 3.  Insight and judgment intact.   LABORATORY DATA:  Sodium 134, potassium 3, chloride 100, bicarb 29, BUN 7, creatinine 1.02, glucose 67.  WBC 20, hemoglobin 13.2, platelets 271.  CT of the right wrist performed revealing diffuse infiltrative subcutaneous fat around the right wrist, consistent with cellulitis.  No discrete abscess, no acute bony abnormalities.  There is an old ununited fracture of the scaphoid bone.   ASSESSMENT AND PLAN:  A 29 year old Caucasian gentleman with history of substance abuse presenting with right wrist pain.  1.  Sepsis meeting septic criteria by heart rate and leukocytosis secondary to cellulitis as well as failure of outpatient treatment.  No evidence of abscess on CAT scan.  Blood cultures obtained.  Antibiotic coverage with vancomycin and Zosyn.  Follow culture data.  Decrease antibiotic coverage as required.  Of note, the patient has self-reported history of methicillin resistant Staphylococcus aureus.  Provide pain control and add bowel regimen.  2.  Hypokalemia.  Replace potassium to goal of 4 to 5.  3.  Venous thromboembolism prophylaxis with heparin subQ.  4.  CODE STATUS:  THE PATIENT IS A FULL CODE.   TIME SPENT:  45 minutes.     ____________________________ Cletis Athens. Jes Costales, MD dkh:ea D: 11/14/2013 23:39:34 ET T: 11/15/2013 00:14:00 ET JOB#: [id number]  cc: Cletis Athens. Izela Altier, MD, <Dictator> Falesha Schommer Synetta Shadow MD ELECTRONICALLY SIGNED 11/16/2013 2:03

## 2014-11-16 NOTE — Op Note (Signed)
PATIENT NAME:  Justin Kelley, Justin Kelley MR#:  [id number] DATE OF BIRTH:  [birth year]  DATE OF PROCEDURE:  11/17/2013  PREOPERATIVE DIAGNOSIS: Abscess of the right wrist.   POSTOPERATIVE DIAGNOSIS: Abscess of the right wrist (cultures pending).   PROCEDURE PERFORMED: Incision, irrigation, and debridement of right wrist abscess.   SURGEON: Illene LabradorJames P. Hooten, MD   ANESTHESIA: General.   ESTIMATED BLOOD LOSS: Minimal.   TOURNIQUET TIME: 21 minutes.   DRAINS: None (packed with iodoform gauze).   INDICATIONS FOR SURGERY: The patient is a 29 year old right-hand dominant male, who presented with a 2 to 3 day history of swelling and erythema to the right forearm and wrist. He was admitted by medicine service with elevated white count and placed on antibiotic coverage. Blood cultures have been negative thus far. There was improvement of the erythema and swelling to the forearm, but localized area of swelling actually increased in size along the ulnar and dorsal aspect of the wrist., Although there was some improvement with regards to the white count, white count was still elevated prior to surgical intervention. Findings were discussed in detail with the patient. The patient expressed understanding of the risks and benefits of surgical intervention and agreed with plans for incision, irrigation and debridement.   PROCEDURE IN DETAIL: The patient was brought to the Operating Room and, after adequate general anesthesia was achieved, a tourniquet was placed on the patient's upper right arm. The patient's right hand and arm were cleaned and prepped with alcohol and Betadine and draped in the usual sterile fashion. A "timeout" was performed as per usual protocol. The arm was elevated and tourniquet was inflated to 250 mmHg. Loupe magnification was used throughout the procedure. An approximately 2.5 to 3 cm incision was made longitudinally over the ulnar and dorsal aspect of the wrist directly over the most prominent  area of swelling. There was immediate drainage of purulent and bloody material from the incision site. Swabs were obtained for a stat Gram stain, culture and sensitivity. Subsequent Gram stain demonstrated gram-positive cocci in pairs with a few WBCs. Sharp debridement of tissue was performed using scalpel. Additional debridement of some of the necrotic tissue was performed using a combination of curettes and soft tissue rongeur. The wound was then irrigated with copious amounts of normal saline with antibiotic solution using bulb syringe. A relatively large cavity remained following the debridement. Tourniquet was deflated after a total tourniquet time of 21 minutes. Good hemostasis was appreciated. It was felt that adequate debridement of the necrotic tissue had been performed. The cavity was packed with iodoform gauze and sterile dressing was applied, followed by application of a bulky hand and forearm dressing.   The patient tolerated the procedure well. He was transported to the recovery room in stable condition.   ____________________________ Illene LabradorJames P. Angie FavaHooten Jr., MD jph:sg D: 11/17/2013 19:16:22 ET T: 11/18/2013 06:30:39 ET JOB#: [id number]  cc: Illene LabradorJames P. Angie FavaHooten Jr., MD, <Dictator> JAMES P Angie FavaHOOTEN JR MD ELECTRONICALLY SIGNED 11/19/2013 0:46

## 2014-11-16 NOTE — H&P (Signed)
PATIENT NAME:  Justin Kelley, Justin Kelley MR#:  [id number] DATE OF BIRTH:  12/28/1985  DATE OF ADMISSION:  12/17/2013  PRIMARY CARE PHYSICIAN: Evelene Croon, MD  REFERRING EMERGENCY ROOM PHYSICIAN: Chiquita Loth, MD  CHIEF COMPLAINT: Right arm swelling and pain.   HISTORY OF PRESENT ILLNESS: The patient is a 29 year old male who has history of recurrent infections, ADHD, anxiety, depression, and also had MRSA in the past. Had right wrist pain and some abscess and drainage was done on April 22nd. He was admitted to hospital and discharged home with Bactrim. He finished the course and was feeling fine and then came back to hospital again on the 22nd of May with similar complaint on right arm with cellulitis. He was treated with antibiotics IV for 3 days in hospital and discharged on 24th of May as his swelling was reducing, his pain was gone and was feeling much better to finish course of oral Bactrim for 2 weeks. As the patient was discharged he went home and he says that his swelling started getting worse again and it is more painful and red now within 24 hours so he decided to come back to Emergency Room again. Started on antibiotic again and given to hospitalist team for further management.   REVIEW OF SYSTEMS: CONSTITUTIONAL: Negative for fever, fatigue, weakness, pain or weight loss.  EYES: No blurring, double vision, discharge or redness.  EARS, NOSE, THROAT: No tinnitus, ear pain or hearing loss.  RESPIRATORY: No cough, wheezing, hemoptysis, or shortness of breath.  CARDIOVASCULAR: No chest pain, orthopnea, edema, arrhythmia, palpitations.  GASTROINTESTINAL: No nausea, vomiting, diarrhea, abdominal pain.  GENITOURINARY: No dysuria or hematuria.  ENDOCRINE: No nocturia. No increased frequency of urination. HEMATOLOGIC: No anemia, easy bruising or bleeding.  SKIN: No acne or rashes, but has some cellulitis type finding of redness and swelling and somewhat warm skin on right dorsum of the hand and on  forearm. NEUROLOGIC: No numbness, weakness, tremor or vertigo.  PSYCHIATRIC: No anxiety or insomnia, but has history of attention deficit disorder, currently appears stable.  PAST MEDICAL HISTORY: 1.  Migraine headache.  2.  Anxiety.  3.  Depression.  4.  Multiple abscesses.  5.  Drug abuse and opiate dependence.  6.  Folliculitis multiple times.  7.  MRSA.   PAST SURGICAL HISTORY: Status post drainage of abscesses.  ALLERGIES: No known drug allergies.   SOCIAL HISTORY: Lives at home with girlfriend. Smokes less than 1/2 pack a day. Occasional alcohol. Denies illegal drug use, but currently on Suboxone for his therapy.   PHYSICAL EXAMINATION: VITAL SIGNS: In the ER, temperature 99.2, pulse 140, respirations 20, blood pressure 156/90, and pulse ox 95 on room air.  GENERAL: The patient fully alert and oriented to time, place, and person. Does not appear in any acute distress.  HEENT: Head and neck atraumatic. Conjunctivae and oral mucosa moist.  NECK: Supple. No JVD.  RESPIRATORY: Bilateral equal air entry.  CARDIOVASCULAR: S1 and S2 present, regular. No murmur.  ABDOMEN: Soft, nontender. Bowel sounds present. No organomegaly.  SKIN: No rashes.  LEGS: No edema.  NEUROLOGIC: Power 5/5. Follows commands. Moves all 4 limbs.  MUSCULOSKELETAL: On the right forearm, in right dorsum of the arm, there is swelling, redness and tenderness present which is worse than yesterday when he was discharged and seen by me.  PSYCHIATRIC: Does not appear in any acute psychiatric illness at this time.  JOINTS: No swelling or tenderness.   DIAGNOSTIC DATA: Important lab results: Creatinine is 1.66, sodium  is 135, potassium is 3.7, chloride is 103, and CO2 is 27. Troponin less than 0.02. Urinalysis is positive for amphetamines and benzodiazepines and cannabinoids. WBC is 10.4, hemoglobin is 14.6, platelet count is 214,000, and MCV is 77   ASSESSMENT AND PLAN: This is a 29 year old male with recurrent  infections and abscesses who was discharged yesterday after treating with IV antibiotic for cellulitis for 3 days and came back as he has worsening of his swelling again overnight. 1.  Cellulitis of right arm. Will give him IV Vanco and meropenem and will get sonogram of right upper extremity to rule out any localized fluid collection or abscess. If he has any, then he might need surgical intervention.  2.  Opiate dependence. He is on Subutex. Continue here.  3.  Anxiety and depression. Taking alprazolam at home. Continue the same.  4.  Attention deficit hyperactivity disorder. He is on Adderall. Continue the same.  5.  Elevated TSH. Free T4 is normal. No interventions at this time.  6.  Smoking. Smoking cessation counseling is done for 4 minutes and he agreed to try quitting now. Will order nicotine patch for him.  TIME SPENT ON ADMISSION: 45 minutes.  ____________________________ Hope PigeonVaibhavkumar G. Elisabeth PigeonVachhani, MD vgv:sb D: 12/17/2013 09:36:07 ET T: 12/17/2013 10:16:32 ET JOB#: [id number]  cc: Hope PigeonVaibhavkumar G. Elisabeth PigeonVachhani, MD, <Dictator> Meindert A. Lacie ScottsNiemeyer, MD Altamese DillingVAIBHAVKUMAR Huey Scalia MD ELECTRONICALLY SIGNED 12/18/2013 16:35

## 2014-11-16 NOTE — Consult Note (Signed)
Brief Consult Note: Diagnosis: Possible right forearm abscess.   Patient was seen by consultant.   Consult note dictated.   Recommend to proceed with surgery or procedure.   Orders entered.   Comments: Patient with recurrent upper extremity cellulitis/abscess.  Had abscess drained by Dr. Ernest PineHooten last month.  More recently, treated with IV antibiotics last week and was discharged after improvement, only to return with a worsening clinic picture.  He has multiple lesions open sore over both upper extremities which could provide nidus for infection.  Patient has history of IVDU.  Adamantly denies current IVDU.  Right midforearm with erythematous, raised area.  Possible abscess.  I have seen forearm abscesses that appear like this that were from self injection into the subcutaneous skin ("skin popping") which must be considered as etiology.  Possible intentional injury/Maunchausen's should be considered as well.  May want to consider psychiatry consult. Patient states he has a history of MRSA.  He is on IV antibiotics and I will re-examine the forearm in the morning.  He is NPO after midnight and has been added to the OR schedule tomorrow for possible I&D.  Electronic Signatures: Juanell FairlyKrasinski, Emmanuelle Coxe (MD)  (Signed 26-May-15 20:18)  Authored: Brief Consult Note   Last Updated: 26-May-15 20:18 by Juanell FairlyKrasinski, Juliannah Ohmann (MD)

## 2014-11-16 NOTE — Consult Note (Signed)
Brief Consult Note: Diagnosis: Cellulitis right forearm and wrist.   Patient was seen by consultant.   Consult note dictated.   Comments: Continue antibiotics. Compressive dressing applied and patient encouraged to elevate the hand. Will reevaluate tomorrow. No purulent drainage. No gross fluctuance. CT scan negative for abscess. Sx have improved significantly with antibiotics, but may need to consider incision, irrigation, & debridement.  Electronic Signatures: Donato HeinzHooten, James P (MD)  (Signed 23-Apr-15 20:26)  Authored: Brief Consult Note   Last Updated: 23-Apr-15 20:26 by Donato HeinzHooten, James P (MD)

## 2014-11-16 NOTE — Consult Note (Signed)
PATIENT NAME:  Justin Kelley, [name] MR#:  [id number] DATE OF BIRTH:  02-26-1986  DATE OF CONSULTATION:  12/18/2013  REFERRING PHYSICIAN:   CONSULTING PHYSICIAN:  Kathreen DevoidKevin L. Trenisha Lafavor, MD  REASON FOR CONSULTATION: Right forearm swelling and pain.   HISTORY OF PRESENT ILLNESS: Mr. Justin Kelley is a 29 year old male who has a history of recurrent infections. It is reported that he has had methicillin-resistant staph aureus infections in the past. The patient recently had right wrist pain and had an abscess drained there on April 22nd by Dr. Francesco SorJames Hooten. He was discharged from the hospital on Bactrim. The patient finished a course of Bactrim and then presented back to the hospital on the 22nd of May with a complaint of forearm cellulitis. He was treated with IV antibiotics in the hospital for 3 days and discharged on May 24th as he was responding to the antibiotics with a reduction and swelling and pain. The patient was sent out on 2 weeks of oral Bactrim. However, within 24 hours the patient returned with increased pain, erythema and swelling. He was admitted to the hospital for antibiotic treatment. He was noted to have swelling over the dorsal aspect of the right forearm at approximately the midpoint. Orthopedics was consulted for evaluation of possible forearm abscess.   The patient's past medical history, past surgical history, family and social history, as well as medication allergies were reviewed from the electronic medical record and the patient's admission H and P.   PHYSICAL EXAMINATION: The patient was alert, sitting up in bed at the time of the consultation. He was using some form of wipe to rub down his arms. This appeared to be a commercially available wipe, possibly antibiotic wipe. Both his upper extremities have multiple lesions and eschar. There are various stages of healing of these lesions. They include the dorsal aspect of his fingers, hand, forearm and upper arm. The patient had an area of  erythema over the dorsal radial aspect of the right forearm. This was firm and tender to touch. There was associated erythema but no obvious fluctuance. The patient was neurovascularly intact in both upper extremities and had palpable radial pulses. The previous area of incision and drainage over the ulnar aspect of the right wrist appeared healed. There was no evidence of a large erythematous region in either upper extremity. He had no pain with wrist, elbow or shoulder movement. There was no evidence of septic joint.   RADIOLOGY: An ultrasound of the patient's right forearm had been performed. This was performed on 12/17/2013. The ultrasound report stated that there was an ill-defined hypoechoic  structure within subcutaneous fat measuring 1.7 x 0.6 x 1.8 cm.  It demonstrated no blood flow. The underlying musculature appeared unremarkable.   ASSESSMENT: Right forearm swelling and tenderness with possible abscess.   PLAN: I explained to Mr. Hefter that the concern of the medical service and infectious disease specialist was that the patient has a forearm abscess. The patient states that he has had multiple abscesses in the past. He seems very nonchalant when talking about these. He even states that there is a potential family history of skin lesions as his mother, he states, has them as well. The patient has a history of intravenous drug use but is adamant that he is currently not using IV drugs.   The right forearm lesion does appear to be consistent with an abscess even though no drainable collection was seen on ultrasound. I agree with the plan of surgical drainage. I made  the patient n.p.o. The patient understands that the multiple lesions he has on his upper extremities are curious. They are all potential entry sites for infection. It is unclear whether the patient may be scratching or whether there are insect bites or other reasons for scratching or skin breakdown. The possibility of intravenous drug  use must be considered. Also, other potential psychologic conditions which may lead to self-inflicted wounds such as Munchausen's should also be considered. The patient may benefit from a psychiatric consult. We will plan for surgical drainage tomorrow, sending cultures to microbiology from the OR. The patient will continue on his IV antibiotics currently as prescribed by the hospitalist and infectious disease services.   ____________________________ Kathreen Devoid, MD klk:cs D: 12/19/2013 18:28:00 ET T: 12/19/2013 18:55:06 ET JOB#: [id number]  cc: Kathreen Devoid, MD, <Dictator>

## 2014-11-16 NOTE — Discharge Summary (Signed)
PATIENT NAME:  Justin Kelley, Justin Kelley MR#:  [id number] DATE OF BIRTH:  [birth year]  DATE OF ADMISSION:  12/14/2013 DATE OF DISCHARGE:  12/16/2013  DISCHARGE DIAGNOSES: 1.  Cellulitis on the skin in the past. Had methicillin-resistant Staphylococcus aureus skin infection.  2.  Anxiety.  3.  Attention deficit hyperactivity disorder.   CONDITION ON DISCHARGE: Stable.   CODE STATUS: FULL CODE.   DISCHARGE MEDICATIONS: 1.  Alprazolam 2 mg oral 3 times a day.  2.  Adderall 30 mg oral once a day.  3.  Bactrim double strength 800 mg plus 160 mg oral tablet 2 times a day for 12 days.   DIET ON DISCHARGE: Regular. Diet consistency: Regular.   ACTIVITY LIMITATION: As tolerated.   TIMEFRAME TO FOLLOWUP: Within 1 to 2 weeks. Advised to follow with primary care physician, Dr. Evelene Croon.   HISTORY OF PRESENT ILLNESS:  A 29 year old Caucasian male with past medical history of attention deficit hyperactivity disorder, anxiety, depression, migraine headache and multiple abscesses in the past with history of MRSA and opiate abuse. Was following with pain management clinic and was on Subutex, presented to Emergency Room with complaint of 2 day history of right hand and forearm swelling and redness. He had a recent admission to hospital on the 22nd of February, was diagnosed with sepsis and cellulitis, treated with IV Zosyn and vancomycin and I and D was done for his abscess and discharged home with oral Bactrim, which he says he finished, and sent home. Later on, 2 days ago, this presentation, he again had the symptoms, so came to the Emergency Room. He was sent home by giving him clindamycin, but it did not help, and so came back again and admitted in this admission. Did not have any fever. Admitted to hospitalist service with cellulitis for IV antibiotic therapy.   HOSPITAL COURSE AND STAY:  1.  The patient was started on IV vancomycin and Zosyn and had gradual improvement in his swelling and pain.  Cultures were done which were negative, and so finally, after 3 days on IV antibiotic therapy, the patient was feeling much better, so switched to oral Bactrim and discharged him home. Advised with 12 day followup of antibiotic.  2.  Opioid dependence. The patient was on pain medication here, and he was feeling fine, so we did not address this issue any further.  3.  Anxiety. We continued alprazolam as he was taking at home.  4.  Attention deficit hyperactivity disorder. He was taking Adderall in the hospital and we continued the same as home medication.  5.  Elevated TSH level. Free T4 was checked, which was normal, so did not do any further workup.  6.  Smoking. Counseling for smoking cessation for 4 minutes, and finally he agreed to quit.   CONSULTATIONS:  None.  IMPORTANT LABORATORY RESULTS:  WBC count 10.1. Hemoglobin was 14.3 and platelet count was 207. Glucose was 82, creatinine 1.18 and sodium level was 36. Potassium is 4.8. SGOT 43, SGPT 29. Total protein serum 8.3.  Albumin is 271. Troponin level less than 0.02. TSH was 5.12. Blood cultures were negative.   Urinalysis is negative. Urine for toxicology is positive for amphetamines and cannabinoids. Thyroxine free is 0.81. T3 is 139. Chest x-ray, portable single view, did not show any acute abnormality.   TOTAL TIME SPENT ON THIS DISCHARGE: 40 minutes.   ____________________________ Hope Pigeon Elisabeth Pigeon, MD vgv:dmm D: 12/18/2013 16:12:00 ET T: 12/18/2013 20:06:16 ET JOB#: [id number]  cc: Hope Pigeon. Elisabeth Pigeon,  MD, <Dictator> Meindert A. Lacie ScottsNiemeyer, MD Altamese DillingVAIBHAVKUMAR Moksh Loomer MD ELECTRONICALLY SIGNED 01/09/2014 9:00

## 2014-11-16 NOTE — Consult Note (Signed)
PATIENT NAME:  Justin Kelley, NO MR#:  [id number] DATE OF BIRTH:  1986-01-29  DATE OF CONSULTATION:  06/19/2014  CONSULTING PHYSICIAN:  Audery Amel, MD  IDENTIFYING INFORMATION AND REASON FOR CONSULTATION:  A 29 year old man with a history of substance abuse who was brought in by police because they thought that he looked intoxicated.   CHIEF COMPLAINT: "She called the police."   HISTORY OF PRESENT ILLNESS: Information obtained from the patient and the chart. The commitment paperwork from the police state that when they came to pick him that he looked like he was intoxicated. They said that he was drooling, staring. There was no allegation of any actual suicidal statements. Apparently the police were called by a woman, who was acquainted with the patient because she alleged that he had stolen her benzodiazepines. When they apprehended him they found some loose benzodiazepines in his pocket. The patient says that he and this woman were arguing last night because she just wants to argue. He denies that he stole anything. He denies that he took any extra doses of any medication beyond what he is normally prescribed. His excuse for why he is tired today is that he did not get any sleep last night. He denies that he has been abusing any drugs, says that he takes his usual medicine as prescribed. Says that his mood has been fine. Denies depression. Denies suicidal ideation. Denies psychotic symptoms. Denies any psychiatric symptoms really at all.   PAST PSYCHIATRIC HISTORY: The patient has a history of abuse of drugs, especially sedatives. He has overdosed in the past and more recently had been identified as abusing medication. Despite his history of drug abuse, he continues to be prescribed large doses of benzodiazepines by an outpatient psychiatrist and also gets prescribed Suboxone and Adderall. He states that his mood is currently fine. Has no active symptoms. He denies that he has ever actually tried  to kill himself in the past.   FAMILY HISTORY: Says that his mother is similar to him.   SOCIAL HISTORY: Lives with his mother. He does not do very much. Little activity during the day.   SUBSTANCE ABUSE HISTORY:  History of IV drug abuse. He has gotten infections in his skin in the past from it. History of abuse of sedatives and opiates in the past. Resistant to insight and treatment.   PAST MEDICAL HISTORY: No significant ongoing medical problems.   REVIEW OF SYSTEMS: Currently says he is feeling a little bit sleepy, he denies depression. Denies suicidal ideation. Denies hallucinations. Denies any other physical symptoms. The rest of the review of systems all negative.   MENTAL STATUS EXAMINATION: Adequately groomed young man who looks his stated age. Sleepy, but arousable. Makes adequate eye contact. Psychomotor activity a little bit slow. Speech slow and quiet, slurs some of his words at times. Stays lucid and oriented however. He is alert and oriented x4. Affect euthymic, and reactive. Mood is stated as okay. Thoughts lucid. No sign of delusional thinking. Denies hallucinations. Denies any suicidal or homicidal ideation whatsoever. He states he feels good about himself in his life and positive about the future. Repeats three out of three words immediately, can only remember one out of three at 3 minutes. Judgment and insight about his substance use is chronically impaired, baseline intelligence normal.   LABORATORY RESULTS: Salicylates normal. CBC normal. Alcohol level negative. Chemistry panel: Slightly low calcium nothing remarkable. Urinalysis unremarkable. Drug screen is positive for amphetamines for cannabis and for benzodiazepines.  VITAL SIGNS: His blood pressure is 127/70, respirations 18, pulse 96, temperature 98.   ASSESSMENT: A 29 year old man with a history of substance abuse. It is entirely possible that he did take excessive amounts of benzodiazepines yesterday. There is no  evidence that he was suicidal about it. No sign of any depression or psychosis or suicidality right now.  The patient is resistant to admitting or discussing the idea that he may have a problem with his use of medications or that it may create problems with him. At this point, he no longer meets commitment criteria.   TREATMENT PLAN: Case discussed with emergency room physician. Does not meet commitment criteria. Discontinue IVC. The patient follows up with his outpatient psychiatrist, Dr. Omelia BlackwaterHeaden. He will continue to follow up with him. He is encouraged to think seriously about his substance use and how it may be affecting him.   DIAGNOSIS PRINCIPAL AND PRIMARY:  AXIS I: Benzodiazepine intoxication.   SECONDARY DIAGNOSIS:  AXIS I: Sedative abuse.  AXIS II: Deferred.  AXIS III:  No further diagnosis.     ____________________________ Audery AmelJohn T. Clapacs, MD jtc:kl D: 06/19/2014 16:39:22 ET T: 06/19/2014 17:35:42 ET JOB#: [id number]  cc: Audery AmelJohn T. Clapacs, MD, <Dictator> Audery AmelJOHN T CLAPACS MD ELECTRONICALLY SIGNED 06/21/2014 14:46

## 2014-11-16 NOTE — Consult Note (Signed)
PATIENT NAME:  Justin Kelley, Justin Kelley MR#:  [id number] DATE OF BIRTH:  Nov 11, 1985  DATE OF CONSULTATION:  03/14/2014  REFERRING PHYSICIAN:    CONSULTING PHYSICIAN:  Audery Amel, MD  IDENTIFYING INFORMATION AND REASON FOR CONSULT:  This is a 29 year old gentleman who originally came to the Emergency Room with complaints of injury to his leg, but was detained on IVC after his mother reported that she thought he was dangerous to himself, concern about substance abuse.   HISTORY OF PRESENT ILLNESS:  Information obtained from the patient and the chart. The patient came to the Emergency Room complaining of a reinjury to his left foot, which he apparently broke a few days ago. Once he was here, it seems that his mother reported concerns that he was talking about killing himself. On interview today, the patient denies any mood symptoms; denies being depressed; denies any suicidal thoughts whatsoever; denies any feeling of hopelessness; denies any hallucinations; says that he sleeps okay. He does report that he does not have much to do with his life and mostly sits around in his room doing nothing. He says that he takes a lot of medication, but he claims that it is all prescribed for him. Specifically, he is prescribed Xanax on a standing basis, takes Suboxone plus has recently been given some pain medicine for his ankle, and in the past has had Adderall prescribed for him. He admits that he uses marijuana regularly, but does not say that he thinks it has been a problem. He totally denies the allegation that he had been talking about killing himself, and has denied it to everyone who was talked to him here in the Emergency Room.   PAST PSYCHIATRIC HISTORY:  The patient has been identified on previous medical visits as having a substance abuse problem. He has an abscess in his arm from injecting drugs, and has been identified as having opiate abuse problems in the past. He is currently seeing an outpatient provider  who gives him Suboxone, but it is evident that he still takes narcotics on top of that. He denies any history of suicide attempts; denies any history of violence; denies any history of psychotic symptoms. He says that he sees Dr. Omelia Blackwater, regularly who prescribes Xanax and Cymbalta for him for his complaints of chronic anxiety.   FAMILY HISTORY:  He claims that his mother has "paranoid schizophrenia."   SOCIAL HISTORY:  Lives at home with his mother; does not work. It sounds like he has a pretty constricted lifestyle, not doing very much.   PAST MEDICAL HISTORY:  He injured his ankle a few days ago. According to the x-ray, it seems like it is a fairly small fracture. He is making something of a big deal of it. He also apparently had a reinjury of some kind to his foot yesterday or today. Otherwise does not appear to have significant ongoing medical problems.   CURRENT MEDICATIONS:  Evidently prescribed Cymbalta, unknown dose a day; Xanax 2 mg 2 to 3 times a day; Suboxone, unknown dose; had previously been taking Adderall.   ALLERGIES:  No known drug allergies.   REVIEW OF SYSTEMS:  Complains of mild pain to his left foot; denies depression, denies suicidal, or homicidal ideation; denies any feeling of hopelessness, denies auditory, or visual hallucinations; generally review of systems otherwise all negative.   MENTAL STATUS EXAMINATION:  Disheveled gentleman, looks his stated age, or younger, cooperative with the interview; eye contact good, psychomotor activity normal given that he  was lying on his back with his foot in a cast; speech normal rate, tone, and volume. Affect smiling, reactive, had a somewhat superficial, and glib quality to it. Mood stated as fine. Thoughts appear to be lucid with no sign of delusions; denies hallucinations, denies any suicidal or homicidal ideation. The patient could recall 2 out of 3 objects at 3 minutes. Longer term memory was grossly intact; normal fund of knowledge,  normal intelligence, alert, and oriented x 4.   LABORATORY RESULTS:  His drug screen today is positive for amphetamines, cocaine, opiates, marijuana, and benzodiazepines; alcohol level negative. Chemistry panel: No clearly significant abnormalities otherwise. CBC: Very slight, low hematocrit almost within the normal range, urinalysis completely normal, salicylates, and acetaminophen negative.   PHYSICAL EXAMINATION:   GENERAL:  Full physical not completed during the consult.  EXTREMITIES:  He has his left ankle wrapped and may have a cast on it. He has some old scars on his arms, and bruises, looks a little rough.  NEUROLOGIC:  Seems to be able to move all extremities.  Cranial nerves all appear to be intact.  VITAL SIGNS:  Temperature 97.7, pulse 72, respirations 18, blood pressure 107/57.   ASSESSMENT:  This is a 29 year old man who almost certainly has polysubstance abuse or dependence problem. Even leading aside his prescription medicine he is clearly using cocaine, and marijuana, probably has been getting amphetamines because his last prescription was over a couple months ago. Has a past clear history of opiate abuse. Patient is minimizing or denying all of his problems, does not see it as a significant condition, and absolutely does not want any inpatient substance abuse treatment. He is not currently reporting major mood symptoms and is not psychotic. Totally denies suicidality. No evidence that he has been trying to harm himself or is acutely dangerous in a way that justify commitment; patient was offered the opportunity to go to the alcohol and drug abuse treatment center, and counseled about substance abuse but declined.   TREATMENT PLAN:  As mentioned, he was offered the opportunity to go to [facility] but declines it. Currently, he does not meet commitment criteria. IVC discontinued. The patient can be released from the Emergency Room, and will follow up with his outpatient psychiatric provider. I  have counseled him to strongly consider the obvious problems he is having with substance abuse and to think about making some changes in his life if only for the obvious damage he is doing to his health.   DIAGNOSIS, PRINCIPAL AND PRIMARY:   AXIS I:  Polysubstance dependence.   SECONDARY DIAGNOSES:  AXIS I:  Anxiety disorder, not otherwise specified.   AXIS II:  Deferred.   AXIS III:  Injured left lower lobe extremity.   AXIS IV:  Severe.   AXIS V:  Functioning at time of evaluation 43.    ____________________________ Audery AmelJohn T. Clapacs, MD jtc:nt D: 03/14/2014 15:49:06 ET T: 03/14/2014 16:29:35 ET JOB#: [id number]  cc: Audery AmelJohn T. Clapacs, MD, <Dictator> Audery AmelJOHN T CLAPACS MD ELECTRONICALLY SIGNED 04/05/2014 16:58

## 2014-11-17 ENCOUNTER — Emergency Department
Admit: 2014-11-17 | Disposition: A | Discharge status: Home / Self Care | Payer: Self-pay | Admitting: Emergency Medicine

## 2014-11-24 DEATH — deceased

## 2015-06-23 ENCOUNTER — Encounter: Payer: Self-pay | Admitting: Internal Medicine

## 2016-01-01 IMAGING — CT CT OF THE RIGHT WRIST WITHOUT CONTRAST
5 of 7 series · 15 of 36 positions shown, 16 images · non-contrast
Comparison: None.

CLINICAL DATA: Increased swelling and redness to the right wrist.
Worsening since yesterday.

EXAM:
CT OF THE RIGHT WRIST WITHOUT CONTRAST
TECHNIQUE: Multidetector CT imaging was performed according to the standard
protocol. Multiplanar CT image reconstructions were also generated.

[Series 602: axial 1.5 bone · axial · 0.36mm/px · z∈[+549,+602]mm · 3 of 71 slices shown, 4 images]
[im 18/71  soft-tissue]
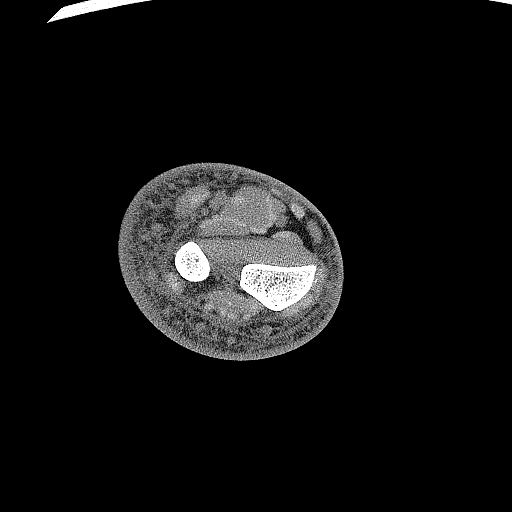
[im 18/71  bone]
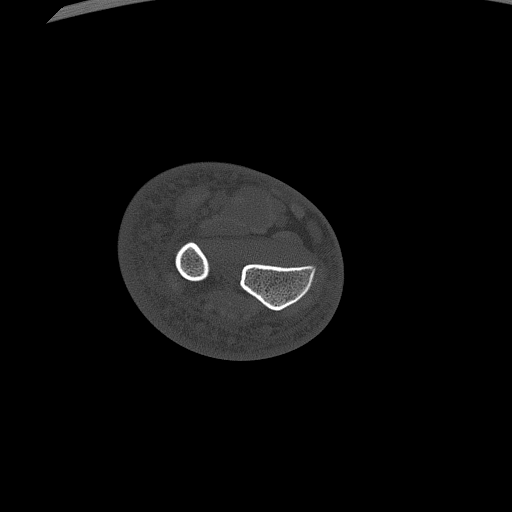
[im 36/71  bone]
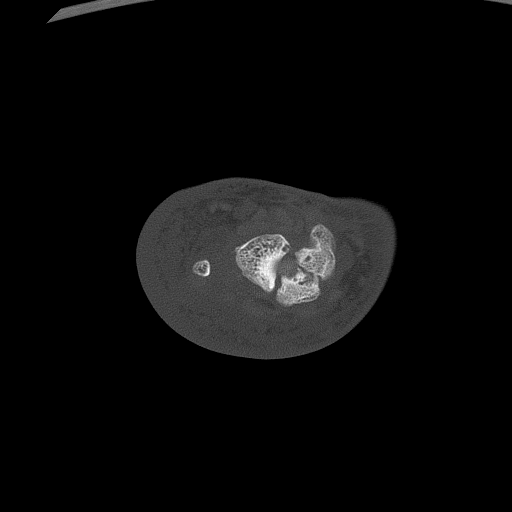
[im 53/71  bone]
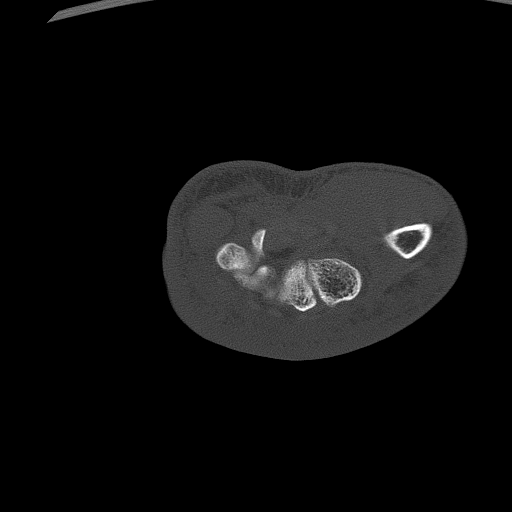

[Series 603: axial bone 2 · axial · 0.36mm/px · z∈[+589,+623]mm · 2 of 52 slices shown]
[im 18/52  bone]
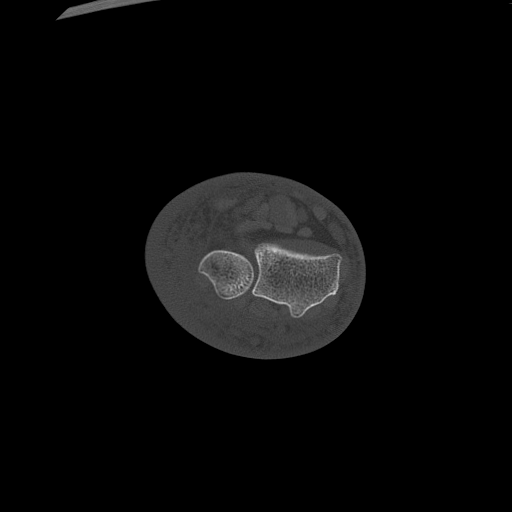
[im 35/52  bone]
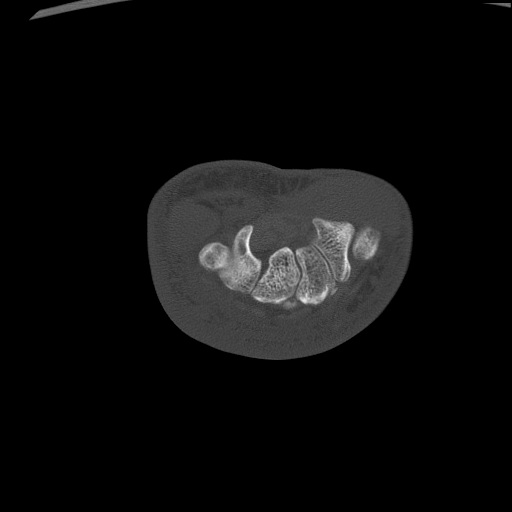

[Series 604: cornals bone 2 · coronal · 0.36mm/px · 1 of 36 slices shown]
[im 18/36  bone]
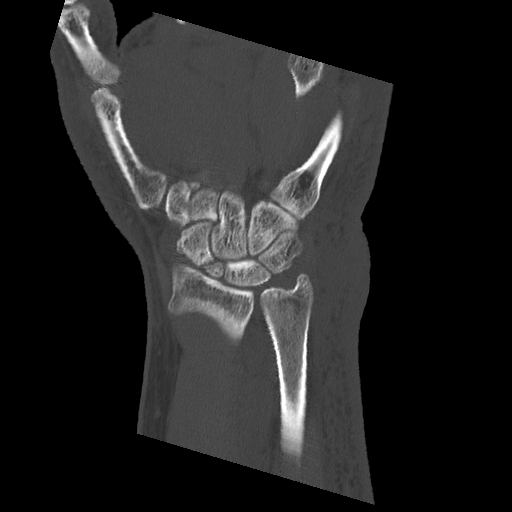

[Series 605: sag bone 2 · sagittal · 0.36mm/px · 6 of 50 slices shown]
[im 9/50  bone]
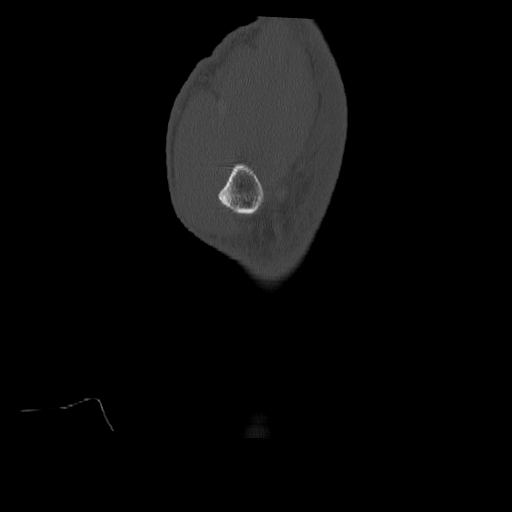
[im 17/50  bone]
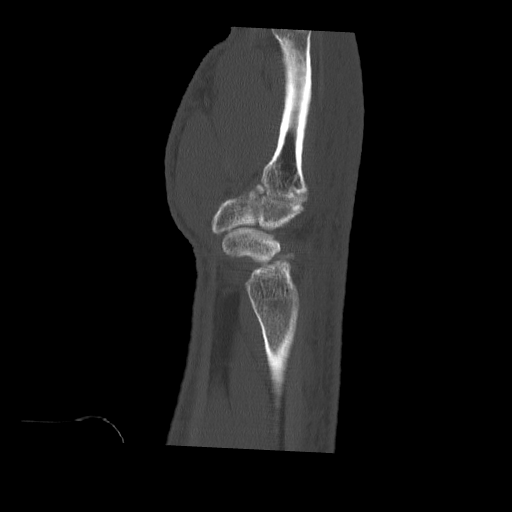
[im 18/50  soft-tissue]
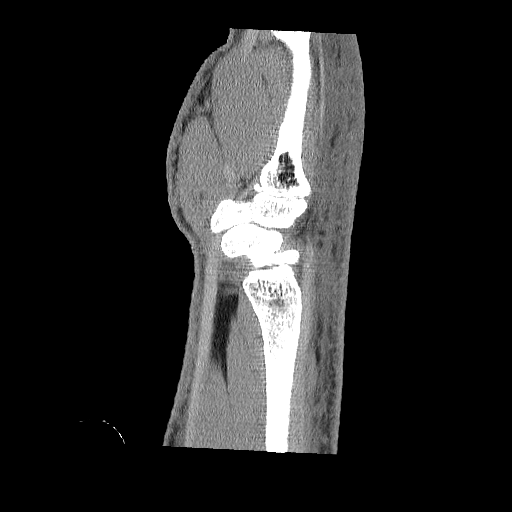
[im 25/50  bone]
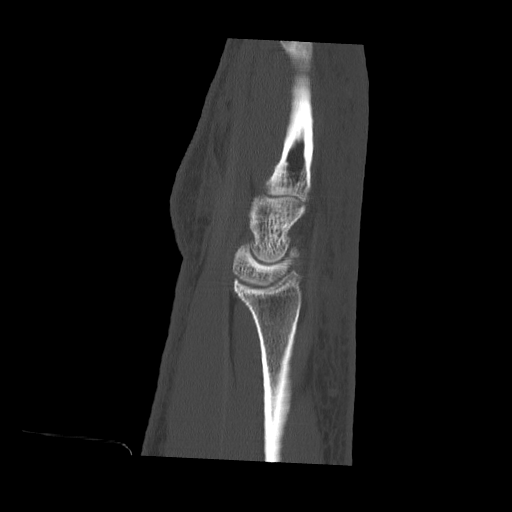
[im 33/50  bone]
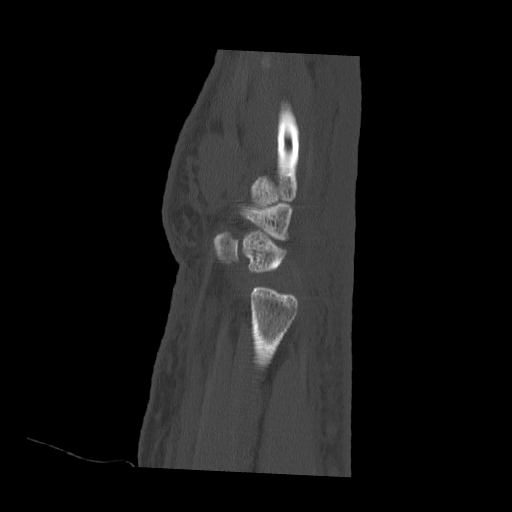
[im 41/50  bone]
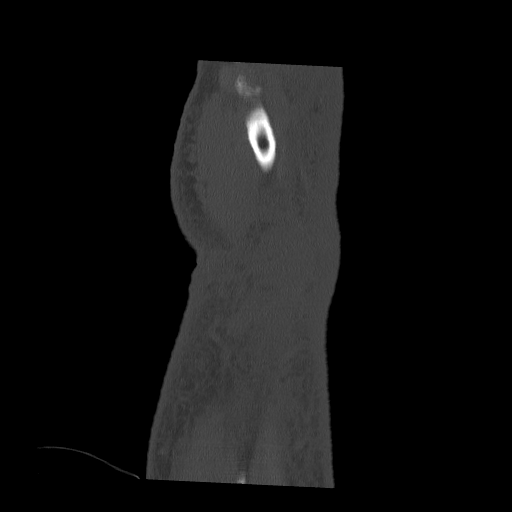

[Series 606: axial st 2 · axial · 0.36mm/px · z∈[+580,+636]mm · 3 of 59 slices shown]
[im 15/59  bone]
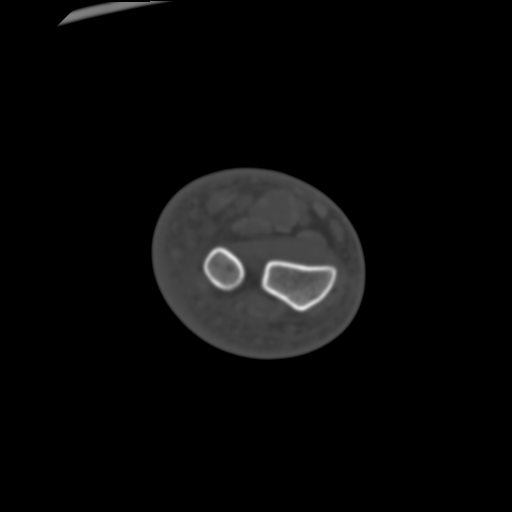
[im 30/59  bone]
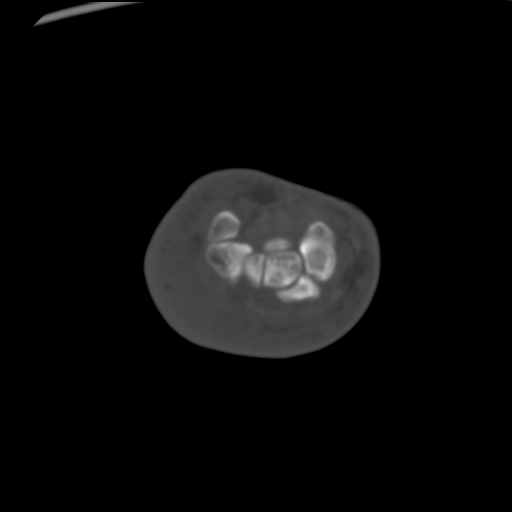
[im 44/59  bone]
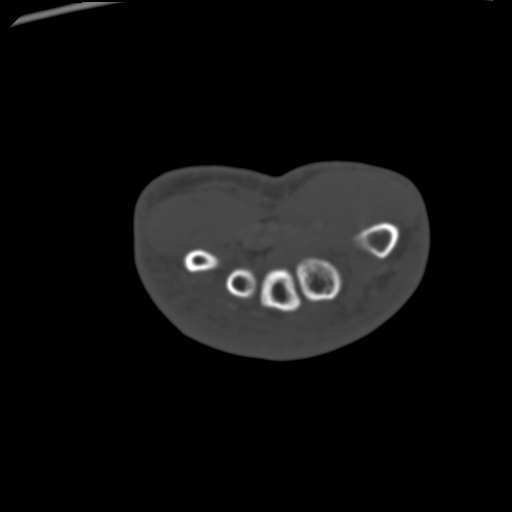

[15 of 36 positions shown; findings below may reference images not displayed]

FINDINGS: There appears to be diffuse infiltration and edema throughout the
subcutaneous fatty tissues of the right wrist, most prominent along
the dorsal in the ulnar side. There is no definite discrete fluid
collection to suggest abscess. Changes are compatible with
cellulitis or diffuse edema. Old ununited fractures of the scaphoid
bone as previously identified. No evidence of cortical erosion to
suggest osteomyelitis. No new fractures are appreciated. No
radiopaque soft tissue foreign bodies or soft tissue gas collections
are appreciated.
IMPRESSION: Diffuse infiltration of subcutaneous fat around the right wrist
consistent with cellulitis and/or edema. No discrete abscess
collection is identified. No acute bony abnormalities. Old ununited
fractures of the scaphoid bone.
